# Patient Record
Sex: Male | Born: 1965 | Race: Black or African American | Hispanic: No | Marital: Married | Smoking: Current every day smoker
Health system: Southern US, Community
[De-identification: ages and names within clinical notes are randomized; demographics above are authoritative.]

## PROBLEM LIST (undated history)

## (undated) DIAGNOSIS — R569 Unspecified convulsions: Secondary | ICD-10-CM

## (undated) DIAGNOSIS — I10 Essential (primary) hypertension: Secondary | ICD-10-CM

## (undated) DIAGNOSIS — E7800 Pure hypercholesterolemia, unspecified: Secondary | ICD-10-CM

## (undated) DIAGNOSIS — K429 Umbilical hernia without obstruction or gangrene: Secondary | ICD-10-CM

## (undated) DIAGNOSIS — J45909 Unspecified asthma, uncomplicated: Secondary | ICD-10-CM

## (undated) HISTORY — PX: SPINAL FIXATION SURGERY: SHX1055

## (undated) HISTORY — PX: SHOULDER SURGERY: SHX246

## (undated) HISTORY — PX: OTHER SURGICAL HISTORY: SHX169

## (undated) HISTORY — PX: BRAIN SURGERY: SHX531

---

## 2005-12-05 ENCOUNTER — Emergency Department (HOSPITAL_COMMUNITY): Admission: EM | Admit: 2005-12-05 | Discharge: 2005-12-05 | Payer: Self-pay | Admitting: Emergency Medicine

## 2009-04-23 ENCOUNTER — Emergency Department (HOSPITAL_COMMUNITY): Admission: EM | Admit: 2009-04-23 | Discharge: 2009-04-23 | Payer: Self-pay | Admitting: Emergency Medicine

## 2009-07-13 ENCOUNTER — Emergency Department (HOSPITAL_COMMUNITY): Admission: EM | Admit: 2009-07-13 | Discharge: 2009-07-13 | Payer: Self-pay | Admitting: Emergency Medicine

## 2009-12-24 ENCOUNTER — Encounter: Admission: RE | Admit: 2009-12-24 | Discharge: 2009-12-24 | Payer: Self-pay | Admitting: Neurosurgery

## 2010-11-26 ENCOUNTER — Emergency Department (HOSPITAL_COMMUNITY)
Admission: EM | Admit: 2010-11-26 | Discharge: 2010-11-26 | Payer: Self-pay | Source: Home / Self Care | Admitting: Emergency Medicine

## 2010-11-29 LAB — BASIC METABOLIC PANEL
BUN: 17 mg/dL (ref 6–23)
CO2: 24 mEq/L (ref 19–32)
Chloride: 108 mEq/L (ref 96–112)
GFR calc Af Amer: >60 mL/min (ref >60)

## 2010-11-29 LAB — RAPID URINE DRUG SCREEN, HOSP PERFORMED
Amphetamines: NOT DETECTED
Opiates: NOT DETECTED
Tetrahydrocannabinol: POSITIVE — AB

## 2010-11-29 LAB — CBC
Hemoglobin: 13.8 g/dL (ref 13.0–17.0)
MCV: 86.5 fL (ref 78.0–100.0)
RBC: 4.58 MIL/uL (ref 4.22–5.81)
WBC: 7.8 10*3/uL (ref 4.0–10.5)

## 2010-11-29 LAB — DIFFERENTIAL
Basophils Absolute: 0 10*3/uL (ref 0.0–0.1)
Basophils Relative: 0 % (ref 0–1)
Lymphocytes Relative: 30 % (ref 12–46)
Monocytes Absolute: 0.5 10*3/uL (ref 0.1–1.0)
Monocytes Relative: 7 % (ref 3–12)
Neutro Abs: 4.8 10*3/uL (ref 1.7–7.7)
Neutrophils Relative %: 62 % (ref 43–77)

## 2010-11-29 LAB — URINALYSIS, ROUTINE W REFLEX MICROSCOPIC
Bilirubin Urine: NEGATIVE
Leukocytes, UA: NEGATIVE
Protein, ur: NEGATIVE mg/dL
Specific Gravity, Urine: 1.024 (ref 1.005–1.030)

## 2010-11-29 LAB — CARBAMAZEPINE LEVEL, TOTAL: Carbamazepine Lvl: <0.3 ug/mL — ABNORMAL LOW (ref 4.0–12.0)

## 2010-11-29 LAB — URINE MICROSCOPIC-ADD ON

## 2011-01-11 ENCOUNTER — Emergency Department (HOSPITAL_COMMUNITY): Payer: Medicare Other

## 2011-01-11 ENCOUNTER — Emergency Department (HOSPITAL_COMMUNITY)
Admission: EM | Admit: 2011-01-11 | Discharge: 2011-01-11 | Disposition: A | Discharge status: Home / Self Care | Payer: Medicare Other | Attending: Emergency Medicine | Admitting: Emergency Medicine

## 2011-01-11 DIAGNOSIS — R51 Headache: Secondary | ICD-10-CM | POA: Insufficient documentation

## 2011-01-11 DIAGNOSIS — G9389 Other specified disorders of brain: Secondary | ICD-10-CM | POA: Insufficient documentation

## 2011-01-11 DIAGNOSIS — Z9889 Other specified postprocedural states: Secondary | ICD-10-CM | POA: Insufficient documentation

## 2011-01-11 DIAGNOSIS — F29 Unspecified psychosis not due to a substance or known physiological condition: Secondary | ICD-10-CM | POA: Insufficient documentation

## 2011-01-11 DIAGNOSIS — G40909 Epilepsy, unspecified, not intractable, without status epilepticus: Secondary | ICD-10-CM | POA: Insufficient documentation

## 2011-01-11 DIAGNOSIS — I10 Essential (primary) hypertension: Secondary | ICD-10-CM | POA: Insufficient documentation

## 2011-01-11 LAB — POCT I-STAT, CHEM 8
Chloride: 104 mEq/L (ref 96–112)
Creatinine, Ser: 1.4 mg/dL (ref 0.4–1.5)
Glucose, Bld: 87 mg/dL (ref 70–99)
Potassium: 3.8 mEq/L (ref 3.5–5.1)
TCO2: 21 mmol/L (ref 0–100)

## 2011-01-11 LAB — BASIC METABOLIC PANEL
BUN: 10 mg/dL (ref 6–23)
Calcium: 9.4 mg/dL (ref 8.4–10.5)
Glucose, Bld: 89 mg/dL (ref 70–99)

## 2011-01-11 LAB — GLUCOSE, CAPILLARY: Glucose-Capillary: 82 mg/dL (ref 70–99)

## 2011-01-11 LAB — DIFFERENTIAL
Eosinophils Absolute: 0 10*3/uL (ref 0.0–0.7)
Eosinophils Relative: 0 % (ref 0–5)
Lymphocytes Relative: 37 % (ref 12–46)
Monocytes Absolute: 0.4 10*3/uL (ref 0.1–1.0)

## 2011-01-11 LAB — URINE MICROSCOPIC-ADD ON

## 2011-01-11 LAB — URINALYSIS, ROUTINE W REFLEX MICROSCOPIC
Bilirubin Urine: NEGATIVE
Nitrite: NEGATIVE
Protein, ur: 30 mg/dL — AB
Specific Gravity, Urine: 1.022 (ref 1.005–1.030)
pH: 5.5 (ref 5.0–8.0)

## 2011-01-11 LAB — CBC
MCH: 29.5 pg (ref 26.0–34.0)
MCHC: 33.5 g/dL (ref 30.0–36.0)
MCV: 88 fL (ref 78.0–100.0)
RDW: 13.5 % (ref 11.5–15.5)

## 2011-01-11 LAB — CARBAMAZEPINE LEVEL, TOTAL: Carbamazepine Lvl: <2 ug/mL — ABNORMAL LOW (ref 4.0–12.0)

## 2011-02-14 LAB — CBC
MCV: 90.6 fL (ref 78.0–100.0)
Platelets: 314 10*3/uL (ref 150–400)
RDW: 14 % (ref 11.5–15.5)

## 2011-02-14 LAB — DIFFERENTIAL
Basophils Relative: 1 % (ref 0–1)
Lymphocytes Relative: 31 % (ref 12–46)
Monocytes Absolute: 0.5 10*3/uL (ref 0.1–1.0)
Monocytes Relative: 10 % (ref 3–12)
Neutro Abs: 3 10*3/uL (ref 1.7–7.7)

## 2011-02-14 LAB — COMPREHENSIVE METABOLIC PANEL
AST: 20 U/L (ref 0–37)
Albumin: 4.1 g/dL (ref 3.5–5.2)
Alkaline Phosphatase: 82 U/L (ref 39–117)
BUN: 17 mg/dL (ref 6–23)
Chloride: 103 mEq/L (ref 96–112)
Creatinine, Ser: 1.05 mg/dL (ref 0.4–1.5)
Glucose, Bld: 97 mg/dL (ref 70–99)
Sodium: 144 mEq/L (ref 135–145)
Total Protein: 7.5 g/dL (ref 6.0–8.3)

## 2011-02-14 LAB — CARBAMAZEPINE LEVEL, TOTAL: Carbamazepine Lvl: 8.3 ug/mL (ref 4.0–12.0)

## 2012-02-05 ENCOUNTER — Observation Stay (HOSPITAL_COMMUNITY)
Admission: EM | Admit: 2012-02-05 | Discharge: 2012-02-06 | Disposition: A | Discharge status: Home / Self Care | Payer: Medicare Other | Attending: Internal Medicine | Admitting: Internal Medicine

## 2012-02-05 ENCOUNTER — Other Ambulatory Visit: Payer: Self-pay

## 2012-02-05 ENCOUNTER — Encounter (HOSPITAL_COMMUNITY): Payer: Self-pay | Admitting: *Deleted

## 2012-02-05 ENCOUNTER — Emergency Department (HOSPITAL_COMMUNITY): Payer: Medicare Other

## 2012-02-05 DIAGNOSIS — K219 Gastro-esophageal reflux disease without esophagitis: Secondary | ICD-10-CM | POA: Insufficient documentation

## 2012-02-05 DIAGNOSIS — F17200 Nicotine dependence, unspecified, uncomplicated: Secondary | ICD-10-CM | POA: Insufficient documentation

## 2012-02-05 DIAGNOSIS — Z720 Tobacco use: Secondary | ICD-10-CM | POA: Diagnosis present

## 2012-02-05 DIAGNOSIS — I10 Essential (primary) hypertension: Secondary | ICD-10-CM | POA: Diagnosis present

## 2012-02-05 DIAGNOSIS — E7800 Pure hypercholesterolemia, unspecified: Secondary | ICD-10-CM | POA: Insufficient documentation

## 2012-02-05 DIAGNOSIS — G40909 Epilepsy, unspecified, not intractable, without status epilepticus: Secondary | ICD-10-CM | POA: Insufficient documentation

## 2012-02-05 DIAGNOSIS — E119 Type 2 diabetes mellitus without complications: Secondary | ICD-10-CM | POA: Insufficient documentation

## 2012-02-05 DIAGNOSIS — Z8249 Family history of ischemic heart disease and other diseases of the circulatory system: Secondary | ICD-10-CM | POA: Insufficient documentation

## 2012-02-05 DIAGNOSIS — R0602 Shortness of breath: Secondary | ICD-10-CM | POA: Insufficient documentation

## 2012-02-05 DIAGNOSIS — R079 Chest pain, unspecified: Secondary | ICD-10-CM | POA: Diagnosis present

## 2012-02-05 DIAGNOSIS — R61 Generalized hyperhidrosis: Secondary | ICD-10-CM | POA: Insufficient documentation

## 2012-02-05 DIAGNOSIS — R0789 Other chest pain: Principal | ICD-10-CM | POA: Insufficient documentation

## 2012-02-05 HISTORY — DX: Essential (primary) hypertension: I10

## 2012-02-05 HISTORY — DX: Unspecified convulsions: R56.9

## 2012-02-05 LAB — CBC
HCT: 37.6 % — ABNORMAL LOW (ref 39.0–52.0)
Hemoglobin: 12.9 g/dL — ABNORMAL LOW (ref 13.0–17.0)
RBC: 4.32 MIL/uL (ref 4.22–5.81)

## 2012-02-05 LAB — DIFFERENTIAL
Lymphs Abs: 4.1 10*3/uL — ABNORMAL HIGH (ref 0.7–4.0)
Monocytes Absolute: 0.5 10*3/uL (ref 0.1–1.0)
Monocytes Relative: 6 % (ref 3–12)
Neutro Abs: 3.4 10*3/uL (ref 1.7–7.7)
Neutrophils Relative %: 41 % — ABNORMAL LOW (ref 43–77)

## 2012-02-05 LAB — BASIC METABOLIC PANEL
BUN: 19 mg/dL (ref 6–23)
CO2: 27 mEq/L (ref 19–32)
Chloride: 100 mEq/L (ref 96–112)
Glucose, Bld: 111 mg/dL — ABNORMAL HIGH (ref 70–99)
Potassium: 3.6 mEq/L (ref 3.5–5.1)

## 2012-02-05 MED ORDER — ONDANSETRON HCL 4 MG/2ML IJ SOLN
4.0000 mg | Freq: Once | INTRAMUSCULAR | Status: AC
  Administered 2012-02-05: 4 mg via INTRAVENOUS
  Filled 2012-02-05: qty 2

## 2012-02-05 MED ORDER — MORPHINE SULFATE 4 MG/ML IJ SOLN
4.0000 mg | Freq: Once | INTRAMUSCULAR | Status: AC
  Administered 2012-02-05: 4 mg via INTRAVENOUS
  Filled 2012-02-05: qty 1

## 2012-02-05 NOTE — ED Provider Notes (Signed)
History     CSN: [id number]  Arrival date & time 02/05/12  2117   First MD Initiated Contact with Patient 02/05/12 2127      Chief Complaint  Patient presents with  . Chest Pain    (Consider location/radiation/quality/duration/timing/severity/associated sxs/prior treatment) Patient is a 46 y.o. male presenting with chest pain. The history is provided by the patient.  Chest Pain The chest pain began 1 - 2 hours ago. Chest pain occurs constantly. The chest pain is improving. At its most intense, the pain is at 7/10. The pain is currently at 3/10. The severity of the pain is moderate. The quality of the pain is described as pressure-like and sharp. The pain does not radiate. Primary symptoms include shortness of breath, nausea and dizziness. Pertinent negatives for primary symptoms include no fever, no palpitations and no abdominal pain.  Dizziness also occurs with nausea and diaphoresis. Dizziness does not occur with weakness.   Associated symptoms include diaphoresis.  Pertinent negatives for associated symptoms include no lower extremity edema, no numbness, no orthopnea and no weakness.   Pt states he has had these chest pains on and off for several months. He states they usually are random and occur at rest. States he does get SOB with walking, but states "I am not very active, i am disabled because of my epilepsy."  States these pains are associated with sob, nausea, diaphoresis. Today he was on the phone with his brother when he had sudden on set of this pain, stats he got sweaty, nauseated, and his brother told him to call EMS. Pt received  325mg  asa, 3 sl nitros, and 4mg  morphine IV. pts pain improved. He has hx of htn on 6 medications for it, he is a smoker, family hx of cardiac disease. He states his doctor is trying to get him to be seen by cardiologist for "a test" but he has not seen them yet.  Past Medical History  Diagnosis Date  . Hypertension   . Diabetes mellitus   .  Seizures     Past Surgical History  Procedure Date  . Brain surgery   . Brain surgery     No family history on file.  History  Substance Use Topics  . Smoking status: Not on file  . Smokeless tobacco: Not on file  . Alcohol Use:       Review of Systems  Constitutional: Positive for diaphoresis. Negative for fever and chills.  HENT: Negative.   Eyes: Negative.   Respiratory: Positive for chest tightness and shortness of breath. Negative for choking.   Cardiovascular: Positive for chest pain. Negative for palpitations, orthopnea and leg swelling.  Gastrointestinal: Positive for nausea. Negative for abdominal pain.  Genitourinary: Negative.   Musculoskeletal: Negative.   Skin: Negative.   Neurological: Positive for dizziness. Negative for weakness and numbness.  Psychiatric/Behavioral: Negative.     Allergies  Keppra  Home Medications   Current Outpatient Rx  Name Route Sig Dispense Refill  . AMLODIPINE BESYLATE 5 MG PO TABS Oral Take 10 mg by mouth daily.    Marland Kitchen CARBAMAZEPINE ER 400 MG PO TB12 Oral Take 800 mg by mouth 2 (two) times daily.    Marland Kitchen CLONIDINE HCL 0.3 MG PO TABS Oral Take 0.3 mg by mouth 2 (two) times daily.    Marland Kitchen HYDRALAZINE HCL 10 MG PO TABS Oral Take 10 mg by mouth 3 (three) times daily.    Marland Kitchen LISINOPRIL-HYDROCHLOROTHIAZIDE 20-12.5 MG PO TABS Oral Take 2 tablets by  mouth daily.    Marland Kitchen METOPROLOL SUCCINATE ER 100 MG PO TB24 Oral Take 100 mg by mouth daily. Take with or immediately following a meal.    . OXYCODONE-ACETAMINOPHEN 5-325 MG PO TABS Oral Take 1 tablet by mouth every 4 (four) hours as needed. For pain    . VALACYCLOVIR HCL 1 G PO TABS Oral Take 2,000 mg by mouth 2 (two) times daily.      BP 140/79  Pulse 64  Resp 15  SpO2 98%  Physical Exam  Nursing note and vitals reviewed. Constitutional: He is oriented to person, place, and time. He appears well-developed and well-nourished.  HENT:  Head: Normocephalic and atraumatic.  Eyes: Conjunctivae  are normal.  Neck: Neck supple.  Cardiovascular: Normal rate, regular rhythm and normal heart sounds.   Pulmonary/Chest: Effort normal and breath sounds normal. No respiratory distress. He has no wheezes. He exhibits no tenderness.  Abdominal: Soft. Bowel sounds are normal. He exhibits no distension. There is no tenderness.  Musculoskeletal: He exhibits no edema.  Neurological: He is alert and oriented to person, place, and time.  Skin: Skin is warm.  Psychiatric: He has a normal mood and affect.    ED Course  Procedures (including critical care time)  Labs Reviewed  CBC - Abnormal; Notable for the following:    Hemoglobin 12.9 (*)    HCT 37.6 (*)    All other components within normal limits  DIFFERENTIAL - Abnormal; Notable for the following:    Neutrophils Relative 41 (*)    Lymphocytes Relative 50 (*)    Lymphs Abs 4.1 (*)    All other components within normal limits  BASIC METABOLIC PANEL - Abnormal; Notable for the following:    Glucose, Bld 111 (*)    GFR calc non Af Amer 77 (*)    GFR calc Af Amer 89 (*)    All other components within normal limits  POCT I-STAT TROPONIN I  URINE RAPID DRUG SCREEN (HOSP PERFORMED)   Dg Chest 2 View  02/05/2012  *RADIOLOGY REPORT*  Clinical Data: Headache, shortness of breath, chest pains.  CHEST - 2 VIEW  Comparison: None.  Findings: Borderline heart size with normal pulmonary vascularity. No focal airspace consolidation in the lungs.  No blunting of costophrenic angles.  No pneumothorax.  Postoperative changes in the cervical spine. Widening of the right acromioclavicular joint space suggesting prior resection or resorption.  IMPRESSION: No evidence of active pulmonary disease.  Original Report Authenticated By: Marlon Pel, M.D.    Date: 02/06/2012  Rate: 66  Rhythm: normal sinus rhythm  QRS Axis: normal  Intervals: normal  ST/T Wave abnormalities: early repolarization  Conduction Disutrbances:none  Narrative Interpretation:     Old EKG Reviewed: unchanged  PT with concerning symptoms. Risk factors are pt has hx of hypertension, male gender, family history of cardiac problems. He is a smoker. No prior cardiac work up. Will admit for rule out.  Spoke with [facility], asked to order lovenox, will admit.  No diagnosis found.    MDM          Lottie Mussel, PA 02/06/12 0030

## 2012-02-05 NOTE — ED Provider Notes (Signed)
Medical screening examination/treatment/procedure(s) were conducted as a shared visit with non-physician practitioner(s) and myself.  I personally evaluated the patient during the encounter 46 year old, male, smoker, with hypertension, presents emergency department complaining of chest pressure, shortness of breath, radiation into his left arm, and diaphoresis.  The symptoms began while he was talking on the telephone with his brother.  He had mild nausea.  Has not vomited.  He got a smoker's cough.  He denies fevers, chills, leg pain or swelling.  His mother had a heart attack when she was 24.  His physical examination is normal.  He was given aspirin and nitroglycerin by EMS.  His EKG shows early repolarization and his initial cardiac enzymes, were normal.  We will get him to the hospital for evaluation and treatment of angina  Cheri Guppy, MD 02/05/12 2318

## 2012-02-05 NOTE — ED Notes (Signed)
Pt called EMS c/o acute onset sternal chest pressure radiating to L arm (onset 2030), accompanied by nausea, sob.  EMS gave 324 asa, 4 0.4 mg nitro, 4 mg morphine. Both dropped pain from 10/10 to 4/10.  18G SL. in RAC.  EMS states nsr of 70 on monitor.

## 2012-02-05 NOTE — ED Notes (Signed)
EKG DONE BY EMT R Malissie Musgrave OLD AND NEW EKG GIVEN TO DR CAPOROSSI 

## 2012-02-05 NOTE — ED Notes (Signed)
Pt states no relief with morphine.  States reduction in chest pressure, but headache continues to be 8/10.

## 2012-02-05 NOTE — ED Notes (Signed)
Wife, Tira's # [phone].

## 2012-02-06 ENCOUNTER — Inpatient Hospital Stay (HOSPITAL_COMMUNITY): Payer: Medicare Other

## 2012-02-06 ENCOUNTER — Encounter (HOSPITAL_COMMUNITY): Payer: Self-pay | Admitting: Internal Medicine

## 2012-02-06 DIAGNOSIS — R079 Chest pain, unspecified: Secondary | ICD-10-CM | POA: Diagnosis present

## 2012-02-06 DIAGNOSIS — G40909 Epilepsy, unspecified, not intractable, without status epilepticus: Secondary | ICD-10-CM | POA: Insufficient documentation

## 2012-02-06 DIAGNOSIS — I10 Essential (primary) hypertension: Secondary | ICD-10-CM | POA: Diagnosis present

## 2012-02-06 DIAGNOSIS — Z720 Tobacco use: Secondary | ICD-10-CM | POA: Diagnosis present

## 2012-02-06 LAB — CBC
MCH: 30.1 pg (ref 26.0–34.0)
MCV: 87.7 fL (ref 78.0–100.0)
Platelets: 286 10*3/uL (ref 150–400)
RBC: 4.32 MIL/uL (ref 4.22–5.81)
RDW: 13.9 % (ref 11.5–15.5)

## 2012-02-06 LAB — CARDIAC PANEL(CRET KIN+CKTOT+MB+TROPI)
CK, MB: 2.4 ng/mL (ref 0.3–4.0)
Total CK: 235 U/L — ABNORMAL HIGH (ref 7–232)
Total CK: 202 U/L (ref 7–232)
Troponin I: <0.3 ng/mL (ref <0.30)

## 2012-02-06 LAB — LIPID PANEL
HDL: 35 mg/dL — ABNORMAL LOW (ref >39)
LDL Cholesterol: 134 mg/dL — ABNORMAL HIGH (ref 0–99)
Triglycerides: 180 mg/dL — ABNORMAL HIGH (ref <150)

## 2012-02-06 LAB — BASIC METABOLIC PANEL
BUN: 20 mg/dL (ref 6–23)
CO2: 28 mEq/L (ref 19–32)
Calcium: 9 mg/dL (ref 8.4–10.5)
Creatinine, Ser: 1.06 mg/dL (ref 0.50–1.35)
Glucose, Bld: 100 mg/dL — ABNORMAL HIGH (ref 70–99)

## 2012-02-06 MED ORDER — CLONIDINE HCL 0.3 MG PO TABS
0.3000 mg | ORAL_TABLET | Freq: Two times a day (BID) | ORAL | Status: DC
  Administered 2012-02-06 (×2): 0.3 mg via ORAL
  Filled 2012-02-06 (×3): qty 1

## 2012-02-06 MED ORDER — OXYCODONE-ACETAMINOPHEN 5-325 MG PO TABS: 1.0000 | ORAL_TABLET | ORAL | Status: DC | PRN

## 2012-02-06 MED ORDER — CARBAMAZEPINE ER 400 MG PO TB12
800.0000 mg | ORAL_TABLET | Freq: Two times a day (BID) | ORAL | Status: DC
  Administered 2012-02-06 (×2): 800 mg via ORAL
  Filled 2012-02-06 (×3): qty 2

## 2012-02-06 MED ORDER — PANTOPRAZOLE SODIUM 40 MG PO TBEC: 40.0000 mg | DELAYED_RELEASE_TABLET | Freq: Every day | ORAL | Status: DC

## 2012-02-06 MED ORDER — ATORVASTATIN CALCIUM 10 MG PO TABS: 10.0000 mg | ORAL_TABLET | Freq: Every day | ORAL | Status: DC

## 2012-02-06 MED ORDER — MORPHINE SULFATE 4 MG/ML IJ SOLN
6.0000 mg | Freq: Once | INTRAMUSCULAR | Status: AC
  Administered 2012-02-06: 6 mg via INTRAVENOUS
  Filled 2012-02-06 (×2): qty 1

## 2012-02-06 MED ORDER — METOPROLOL SUCCINATE ER 100 MG PO TB24
100.0000 mg | ORAL_TABLET | Freq: Every day | ORAL | Status: DC
  Administered 2012-02-06: 100 mg via ORAL
  Filled 2012-02-06: qty 1

## 2012-02-06 MED ORDER — ASPIRIN EC 325 MG PO TBEC
325.0000 mg | DELAYED_RELEASE_TABLET | Freq: Every day | ORAL | Status: DC
  Administered 2012-02-06: 325 mg via ORAL
  Filled 2012-02-06: qty 1

## 2012-02-06 MED ORDER — OXYCODONE-ACETAMINOPHEN 5-325 MG PO TABS
1.0000 | ORAL_TABLET | ORAL | Status: DC | PRN
  Administered 2012-02-06 (×2): 1 via ORAL
  Filled 2012-02-06 (×2): qty 1

## 2012-02-06 MED ORDER — ASPIRIN 325 MG PO TBEC: 325.0000 mg | DELAYED_RELEASE_TABLET | Freq: Every day | ORAL | Status: AC

## 2012-02-06 MED ORDER — SODIUM CHLORIDE 0.9 % IJ SOLN: 3.0000 mL | INTRAMUSCULAR | Status: DC | PRN

## 2012-02-06 MED ORDER — AMLODIPINE BESYLATE 10 MG PO TABS
10.0000 mg | ORAL_TABLET | Freq: Every day | ORAL | Status: DC
  Administered 2012-02-06: 10 mg via ORAL
  Filled 2012-02-06: qty 1

## 2012-02-06 MED ORDER — REGADENOSON 0.4 MG/5ML IV SOLN
0.4000 mg | Freq: Once | INTRAVENOUS | Status: AC
  Administered 2012-02-06: 0.4 mg via INTRAVENOUS

## 2012-02-06 MED ORDER — ENOXAPARIN SODIUM 150 MG/ML ~~LOC~~ SOLN: 1.0000 mg/kg | Freq: Two times a day (BID) | SUBCUTANEOUS | Status: DC

## 2012-02-06 MED ORDER — TECHNETIUM TC 99M TETROFOSMIN IV KIT
10.0000 | PACK | Freq: Once | INTRAVENOUS | Status: AC | PRN
  Administered 2012-02-06: 10 via INTRAVENOUS

## 2012-02-06 MED ORDER — TECHNETIUM TC 99M TETROFOSMIN IV KIT
30.0000 | PACK | Freq: Once | INTRAVENOUS | Status: AC | PRN
  Administered 2012-02-06: 30 via INTRAVENOUS

## 2012-02-06 MED ORDER — LISINOPRIL 20 MG PO TABS
20.0000 mg | ORAL_TABLET | Freq: Every day | ORAL | Status: DC
  Administered 2012-02-06: 20 mg via ORAL
  Filled 2012-02-06: qty 1

## 2012-02-06 MED ORDER — ENOXAPARIN SODIUM 80 MG/0.8ML ~~LOC~~ SOLN
80.0000 mg | SUBCUTANEOUS | Status: DC
  Filled 2012-02-06: qty 0.8

## 2012-02-06 MED ORDER — HYDRALAZINE HCL 10 MG PO TABS
10.0000 mg | ORAL_TABLET | Freq: Three times a day (TID) | ORAL | Status: DC
  Administered 2012-02-06: 10 mg via ORAL
  Filled 2012-02-06 (×3): qty 1

## 2012-02-06 MED ORDER — SODIUM CHLORIDE 0.9 % IJ SOLN
3.0000 mL | Freq: Two times a day (BID) | INTRAMUSCULAR | Status: DC
  Administered 2012-02-06 (×2): 3 mL via INTRAVENOUS

## 2012-02-06 MED ORDER — LISINOPRIL-HYDROCHLOROTHIAZIDE 20-12.5 MG PO TABS: 2.0000 | ORAL_TABLET | Freq: Every day | ORAL | Status: DC

## 2012-02-06 MED ORDER — ATORVASTATIN CALCIUM 10 MG PO TABS
10.0000 mg | ORAL_TABLET | Freq: Every day | ORAL | Status: DC
  Filled 2012-02-06: qty 1

## 2012-02-06 MED ORDER — VALACYCLOVIR HCL 500 MG PO TABS
2000.0000 mg | ORAL_TABLET | Freq: Two times a day (BID) | ORAL | Status: DC
  Administered 2012-02-06: 2000 mg via ORAL
  Administered 2012-02-06: 1000 mg via ORAL
  Filled 2012-02-06 (×3): qty 4

## 2012-02-06 MED ORDER — ENOXAPARIN SODIUM 80 MG/0.8ML ~~LOC~~ SOLN
80.0000 mg | Freq: Two times a day (BID) | SUBCUTANEOUS | Status: DC
  Administered 2012-02-06: 80 mg via SUBCUTANEOUS
  Filled 2012-02-06 (×2): qty 0.8

## 2012-02-06 MED ORDER — HYDROCHLOROTHIAZIDE 12.5 MG PO CAPS
12.5000 mg | ORAL_CAPSULE | Freq: Every day | ORAL | Status: DC
  Administered 2012-02-06: 12.5 mg via ORAL
  Filled 2012-02-06: qty 1

## 2012-02-06 MED ORDER — HYDROMORPHONE HCL PF 1 MG/ML IJ SOLN: 1.0000 mg | INTRAMUSCULAR | Status: DC | PRN

## 2012-02-06 MED ORDER — SODIUM CHLORIDE 0.9 % IV SOLN: 250.0000 mL | INTRAVENOUS | Status: DC | PRN

## 2012-02-06 MED ORDER — SODIUM CHLORIDE 0.9 % IJ SOLN: 3.0000 mL | Freq: Two times a day (BID) | INTRAMUSCULAR | Status: DC

## 2012-02-06 NOTE — ED Notes (Signed)
MD at bedside. Internal med here to see pt

## 2012-02-06 NOTE — Progress Notes (Signed)
ANTICOAGULATION CONSULT NOTE - Initial Consult  Pharmacy Consult for lovenox  Indication: chest pain/ACS  Allergies  Allergen Reactions  . Keppra     Pt states, "When I take Keppra it makes me feel like I am having a heart attack."     Patient Measurements: Weight: 169 lb 5 oz (76.8 kg) Heparin Dosing Weight:   Vital Signs: BP: 148/83 mmHg (04/01 0000) Pulse Rate: 70  (04/01 0000)  Labs:  Basename 02/05/12 2143  HGB 12.9*  HCT 37.6*  PLT 298  APTT --  LABPROT --  INR --  HEPARINUNFRC --  CREATININE 1.12  CKTOTAL --  CKMB --  TROPONINI --   CrCl is unknown because there is no height on file for the current visit.  Medical History: Past Medical History  Diagnosis Date  . Hypertension   . Diabetes mellitus   . Seizures     Medications:  Prescriptions prior to admission  Medication Sig Dispense Refill  . amLODipine (NORVASC) 5 MG tablet Take 10 mg by mouth daily.      . carbamazepine (TEGRETOL XR) 400 MG 12 hr tablet Take 800 mg by mouth 2 (two) times daily.      . cloNIDine (CATAPRES) 0.3 MG tablet Take 0.3 mg by mouth 2 (two) times daily.      . hydrALAZINE (APRESOLINE) 10 MG tablet Take 10 mg by mouth 3 (three) times daily.      Marland Kitchen lisinopril-hydrochlorothiazide (PRINZIDE,ZESTORETIC) 20-12.5 MG per tablet Take 2 tablets by mouth daily.      . metoprolol succinate (TOPROL-XL) 100 MG 24 hr tablet Take 100 mg by mouth daily. Take with or immediately following a meal.      . oxyCODONE-acetaminophen (PERCOCET) 5-325 MG per tablet Take 1 tablet by mouth every 4 (four) hours as needed. For pain      . valACYclovir (VALTREX) 1000 MG tablet Take 2,000 mg by mouth 2 (two) times daily.        Assessment: 46 yo male with chest pain. Risk factors include htn dm and smoking and strong family hx. lovenox full dose while R/O MI Goal of Therapy:  Full dose lovenox renally adjusted if pertinent    Plan:  lovenox 80 mg q12h (next dose at noon) cbc q72 hours while on  lovenox.   Janice Coffin 02/06/2012,1:13 AM

## 2012-02-06 NOTE — Progress Notes (Signed)
Patient ID: Shawn Potts  male  EQA:[id number]    DOB: 10/12/1966    DOA: 02/05/2012  PCP: [name], MD, MD  Subjective: Chest pain improving, atypical  Objective: Weight change:  No intake or output data in the 24 hours ending 02/06/12 1604 Blood pressure 121/73, pulse 53, temperature 97.8 F (36.6 C), resp. rate 18, height 5\' 8"  (1.727 m), weight 73.9 kg (162 lb 14.7 oz), SpO2 97.00%.  Physical Exam: General: Alert and awake, oriented x3, not in any acute distress. HEENT: anicteric sclera, pupils reactive to light and accommodation, EOMI CVS: S1-S2 clear, no murmur rubs or gallops Chest: Chest wall tenderness on the palpation clear to auscultation bilaterally, no wheezing, rales or rhonchi Abdomen: soft nontender, nondistended, normal bowel sounds, no organomegaly Extremities: no cyanosis, clubbing or edema noted bilaterally Neuro: Cranial nerves II-XII intact, no focal neurological deficits  Lab Results: Basic Metabolic Panel:  Lab 02/06/12 9798 02/05/12 2143  NA 141 139  K 4.4 3.6  CL 106 100  CO2 28 27  GLUCOSE 100* 111*  BUN 20 19  CREATININE 1.06 1.12  CALCIUM 9.0 9.4  MG -- --  PHOS -- --   CBC:  Lab 02/06/12 0143 02/05/12 2143  WBC 8.9 8.2  NEUTROABS -- 3.4  HGB 13.0 12.9*  HCT 37.9* 37.6*  MCV 87.7 87.0  PLT 286 298   Cardiac Enzymes:  Lab 02/06/12 1349 02/06/12 0857 02/06/12 0130  CKTOTAL 202 235* 291*  CKMB 2.3 2.4 2.5  CKMBINDEX -- -- --  TROPONINI <0.30 <0.30 <0.30    Studies/Results: Dg Chest 2 View  02/05/2012  *RADIOLOGY REPORT*  Clinical Data: Headache, shortness of breath, chest pains.  CHEST - 2 VIEW  Comparison: None.  Findings: Borderline heart size with normal pulmonary vascularity. No focal airspace consolidation in the lungs.  No blunting of costophrenic angles.  No pneumothorax.  Postoperative changes in the cervical spine. Widening of the right acromioclavicular joint space suggesting prior resection or resorption.   IMPRESSION: No evidence of active pulmonary disease.  Original Report Authenticated By: Marlon Pel, M.D.    Medications: Scheduled Meds:   . amLODipine  10 mg Oral Daily  . aspirin EC  325 mg Oral Daily  . atorvastatin  10 mg Oral q1800  . carbamazepine  800 mg Oral BID  . cloNIDine  0.3 mg Oral BID  . enoxaparin (LOVENOX) injection  80 mg Subcutaneous To Major  . enoxaparin (LOVENOX) injection  80 mg Subcutaneous Q12H  . hydrALAZINE  10 mg Oral TID  . hydrochlorothiazide  12.5 mg Oral Daily  . lisinopril  20 mg Oral Daily  . metoprolol succinate  100 mg Oral Daily  . morphine  4 mg Intravenous Once  . morphine  6 mg Intravenous Once  . ondansetron  4 mg Intravenous Once  . regadenoson  0.4 mg Intravenous Once  . sodium chloride  3 mL Intravenous Q12H  . valACYclovir  2,000 mg Oral BID  . DISCONTD: enoxaparin (LOVENOX) injection  1 mg/kg Subcutaneous Q12H  . DISCONTD: lisinopril-hydrochlorothiazide  2 tablet Oral Daily  . DISCONTD: sodium chloride  3 mL Intravenous Q12H   Continuous Infusions:    Assessment/Plan: Principal Problem:  *Chest pain: Atypical but high risk given hypertension and hyperlipidemia, however has a chest wall tenderness on deep palpation - Patient denies history of diabetes mellitus - Cardiac enzymes x2 negative, stress test done today, results pending - Appreciate cardiology conditions  Active Problems:  HTN (hypertension): Continue current antihypertensives  Tobacco abuse: Counseled strongly to quit smoking, patient has prescription of Chantix from his primary M.D.  DVT Prophylaxis: Lovenox  Code Status: Full code  Disposition: Awaiting stress test results for further management   LOS: 1 day   Sid Greener M.D. [facility] 02/06/2012, 4:04 PM Pager: [phone]

## 2012-02-06 NOTE — ED Notes (Signed)
Complain of HA Md notified, waiting for orders

## 2012-02-06 NOTE — Consult Note (Signed)
Pt smokes 1/2 ppd and is in action stage. Has Rx from his MD for Chantix and plans to start them after d/c. Referred to 1-800 quit now for f/u and support. Discussed oral fixation substitutes, second hand smoke and in home smoking policy. Reviewed and gave pt Written education/contact information.

## 2012-02-06 NOTE — Discharge Summary (Signed)
Physician Discharge Summary  Patient ID: Shawn Potts MRN: [id number] DOB/AGE: 04-12-1966 46 y.o.  Admit date: 02/05/2012 Discharge date: 02/06/2012  Primary Care Physician:  [name], MD, MD  Discharge Diagnoses:    . atypical Chest pain, ACS ruled out , stress test EF 53% no reversible ischemia/infarction, likely MSK  .HTN (hypertension) .Tobacco abuse GERD  Consults: cardiology ( Dr. Sharyn Lull)  Discharge Medications: Medication List  As of 02/06/2012  4:14 PM   TAKE these medications         amLODipine 5 MG tablet   Commonly known as: NORVASC   Take 10 mg by mouth daily.      aspirin 325 MG EC tablet   Take 1 tablet (325 mg total) by mouth daily.      atorvastatin 10 MG tablet   Commonly known as: LIPITOR   Take 1 tablet (10 mg total) by mouth daily.      carbamazepine 400 MG 12 hr tablet   Commonly known as: TEGRETOL XR   Take 800 mg by mouth 2 (two) times daily.      cloNIDine 0.3 MG tablet   Commonly known as: CATAPRES   Take 0.3 mg by mouth 2 (two) times daily.      hydrALAZINE 10 MG tablet   Commonly known as: APRESOLINE   Take 10 mg by mouth 3 (three) times daily.      lisinopril-hydrochlorothiazide 20-12.5 MG per tablet   Commonly known as: PRINZIDE,ZESTORETIC   Take 2 tablets by mouth daily.      metoprolol succinate 100 MG 24 hr tablet   Commonly known as: TOPROL-XL   Take 100 mg by mouth daily. Take with or immediately following a meal.      oxyCODONE-acetaminophen 5-325 MG per tablet   Commonly known as: PERCOCET   Take 1 tablet by mouth every 4 (four) hours as needed for pain. For pain      pantoprazole 40 MG tablet   Commonly known as: PROTONIX   Take 1 tablet (40 mg total) by mouth daily.      valACYclovir 1000 MG tablet   Commonly known as: VALTREX   Take 2,000 mg by mouth 2 (two) times daily.             Brief H and P: For complete details please refer to admission H and P, but in brief, patient is a 46 year old male with  a history of seizure disorder, hypertension, chronic neck pain on narcotics, tobacco abuse, family history of premature gallbladder disease presented to the emergency room with intermittent substernal chest pain for past 3 weeks. Evaluation in ED included EKG with nonspecific ST-T wave changes negative cardiac markers. Patient was admitted for further workup and ACS rule out  Hospital Course:   Chest pain: Atypical but high risk given hypertension and hyperlipidemia, however he did have chest wall tenderness on deep palpation. Patient denied any history of diabetes mellitus. Patient was admitted for observation, cardiac enzymes were obtained which was negative. Cardiology was consulted, patient was seen by Dr Sharyn Lull. The patient underwent nuclear medicine stress test which showed EF of 53% with no reversible ischemia/infarction. Patient was continued on pain medication given muscle skeletal component to the pain. Patient did have 2-D echocardiogram, results of which are pending at the time of discharge. He was also given a prescription for PPI. Patient will follow up with his primary care physician in 10 days for hospital followup.  HTN (hypertension): Continue current antihypertensives   Tobacco abuse:  Counseled strongly to quit smoking, patient has prescription of Chantix from his primary M.D.   Day of Discharge BP 121/73  Pulse 53  Temp 97.8 F (36.6 C)  Resp 18  Ht 5\' 8"  (1.727 m)  Wt 73.9 kg (162 lb 14.7 oz)  BMI 24.77 kg/m2  SpO2 97%  Physical Exam: General: Alert and awake oriented x3 not in any acute distress. HEENT: anicteric sclera, pupils reactive to light and accommodation CVS: S1-S2 clear no murmur rubs or gallops Chest: chest wall tenderness to deep palpation, clear to auscultation bilaterally, no wheezing rales or rhonchi Abdomen: soft nontender, nondistended, normal bowel sounds, no organomegaly Extremities: no cyanosis, clubbing or edema noted bilaterally Neuro: Cranial  nerves II-XII intact, no focal neurological deficits   The results of significant diagnostics from this hospitalization (including imaging, microbiology, ancillary and laboratory) are listed below for reference.    LAB RESULTS: Basic Metabolic Panel:  Lab 02/06/12 4098 02/05/12 2143  NA 141 139  K 4.4 3.6  CL 106 100  CO2 28 27  GLUCOSE 100* 111*  BUN 20 19  CREATININE 1.06 1.12  CALCIUM 9.0 9.4  MG -- --  PHOS -- --   CBC:  Lab 02/06/12 0143 02/05/12 2143  WBC 8.9 8.2  NEUTROABS -- 3.4  HGB 13.0 12.9*  HCT 37.9* 37.6*  MCV 87.7 --  PLT 286 298   Cardiac Enzymes:  Lab 02/06/12 1349 02/06/12 0857  CKTOTAL 202 235*  CKMB 2.3 2.4  CKMBINDEX -- --  TROPONINI <0.30 <0.30      Disposition and Follow-up: Discharge Orders    Future Orders Please Complete By Expires   Diet - low sodium heart healthy      Increase activity slowly          DISPOSITION: home   DIET:heart healthy diet  ACTIVITY: as tolerated  DISCHARGE FOLLOW-UP Follow-up Information    Follow up with [name], MD. Schedule an appointment as soon as possible for a visit in 10 days. [facility] FOLLOW-UP)    Contact information:   [address] [facility] [place] [address] [phone]          Time spent on Discharge: 45 minutes  Signed:  Blaine Hari M.D. [facility] 02/06/2012, 4:14 PM

## 2012-02-06 NOTE — Progress Notes (Signed)
Discharge instructions given to and reviewed with patient who verbalizes understanding, patient reports chronic neck pain level 3/10.  Prescriptions given, skin intact.  Patient discharged home with family.  Shawn Potts

## 2012-02-06 NOTE — H&P (Signed)
PCP:   Karn Pickler, MD, MD   Chief Complaint: Intermittent chest pain for 3 weeks   HPI: Shawn Potts is an 46 y.o. male with history of seizure disorder, hypertension, diabetes, chronic neck pain on narcotic, tobacco abuse, family history of premature coronary disease, presents to the emergency room complaining of intermittent substernal chest pain for the past 3 weeks. He denied any exertional chest pain. Tonight, while talking on the phone with his brother, developed substernal chest pain along with diaphoresis. Evaluation in emergency room included an EKG with nonspecific ST-T changes, negative cardiac markers, negative chest x-ray, and unremarkable serological studies. Hospitalists was asked to admit him for cardiac rule out.  Rewiew of Systems:  The patient denies anorexia, fever, weight loss,, vision loss, decreased hearing, hoarseness, syncope, dyspnea on exertion, peripheral edema, balance deficits, hemoptysis, abdominal pain, melena, hematochezia, severe indigestion/heartburn, hematuria, incontinence, genital sores, muscle weakness, suspicious skin lesions, transient blindness, difficulty walking, depression, unusual weight change, abnormal bleeding, enlarged lymph nodes, angioedema, and breast masses.   Past Medical History  Diagnosis Date  . Hypertension   . Diabetes mellitus   . Seizures     Past Surgical History  Procedure Date  . Brain surgery   . Brain surgery     Medications:  HOME MEDS: Prior to Admission medications   Medication Sig Start Date End Date Taking? Authorizing Provider  amLODipine (NORVASC) 5 MG tablet Take 10 mg by mouth daily.   Yes Historical Provider, MD  carbamazepine (TEGRETOL XR) 400 MG 12 hr tablet Take 800 mg by mouth 2 (two) times daily.   Yes Historical Provider, MD  cloNIDine (CATAPRES) 0.3 MG tablet Take 0.3 mg by mouth 2 (two) times daily.   Yes Historical Provider, MD  hydrALAZINE (APRESOLINE) 10 MG tablet Take 10 mg by  mouth 3 (three) times daily.   Yes Historical Provider, MD  lisinopril-hydrochlorothiazide (PRINZIDE,ZESTORETIC) 20-12.5 MG per tablet Take 2 tablets by mouth daily.   Yes Historical Provider, MD  metoprolol succinate (TOPROL-XL) 100 MG 24 hr tablet Take 100 mg by mouth daily. Take with or immediately following a meal.   Yes Historical Provider, MD  oxyCODONE-acetaminophen (PERCOCET) 5-325 MG per tablet Take 1 tablet by mouth every 4 (four) hours as needed. For pain   Yes Historical Provider, MD  valACYclovir (VALTREX) 1000 MG tablet Take 2,000 mg by mouth 2 (two) times daily.   Yes Historical Provider, MD     Allergies:  Allergies  Allergen Reactions  . Keppra     Pt states, "When I take Keppra it makes me feel like I am having a heart attack."     Social History:   does not have a smoking history on file. He does not have any smokeless tobacco history on file. His alcohol and drug histories not on file.  Family History: No family history on file.   Physical Exam: Filed Vitals:   02/05/12 2303 02/06/12 0000 02/06/12 0059  BP: 140/79 148/83   Pulse: 64 70   Resp: 15 25   Weight:   76.8 kg (169 lb 5 oz)  SpO2: 98% 98%    Blood pressure 148/83, pulse 70, resp. rate 25, weight 76.8 kg (169 lb 5 oz), SpO2 98.00%.  GEN:  Pleasant  person lying in the stretcher in no acute distress; cooperative with exam PSYCH:  alert and oriented x4; does not appear anxious or depressed; affect is appropriate. HEENT: Mucous membranes pink and anicteric; PERRLA; EOM intact; no cervical lymphadenopathy nor thyromegaly  or carotid bruit; no JVD; Breasts:: Not examined CHEST WALL: No tenderness CHEST: Normal respiration, clear to auscultation bilaterally HEART: Regular rate and rhythm; no murmurs rubs or gallops BACK: No kyphosis or scoliosis; no CVA tenderness ABDOMEN: Obese, soft non-tender; no masses, no organomegaly, normal abdominal bowel sounds; no pannus; no intertriginous candida. Rectal  Exam: Not done EXTREMITIES: No bone or joint deformity; age-appropriate arthropathy of the hands and knees; no edema; no ulcerations. Genitalia: not examined PULSES: 2+ and symmetric SKIN: Normal hydration no rash or ulceration CNS: Cranial nerves 2-12 grossly intact no focal lateralizing neurologic deficit   Labs & Imaging Results for orders placed during the hospital encounter of 02/05/12 (from the past 48 hour(s))  CBC     Status: Abnormal   Collection Time   02/05/12  9:43 PM      Component Value Range Comment   WBC 8.2  4.0 - 10.5 (K/uL)    RBC 4.32  4.22 - 5.81 (MIL/uL)    Hemoglobin 12.9 (*) 13.0 - 17.0 (g/dL)    HCT 69.6 (*) 29.5 - 52.0 (%)    MCV 87.0  78.0 - 100.0 (fL)    MCH 29.9  26.0 - 34.0 (pg)    MCHC 34.3  30.0 - 36.0 (g/dL)    RDW 28.4  13.2 - 44.0 (%)    Platelets 298  150 - 400 (K/uL)   DIFFERENTIAL     Status: Abnormal   Collection Time   02/05/12  9:43 PM      Component Value Range Comment   Neutrophils Relative 41 (*) 43 - 77 (%)    Neutro Abs 3.4  1.7 - 7.7 (K/uL)    Lymphocytes Relative 50 (*) 12 - 46 (%)    Lymphs Abs 4.1 (*) 0.7 - 4.0 (K/uL)    Monocytes Relative 6  3 - 12 (%)    Monocytes Absolute 0.5  0.1 - 1.0 (K/uL)    Eosinophils Relative 3  0 - 5 (%)    Eosinophils Absolute 0.2  0.0 - 0.7 (K/uL)    Basophils Relative 0  0 - 1 (%)    Basophils Absolute 0.0  0.0 - 0.1 (K/uL)   BASIC METABOLIC PANEL     Status: Abnormal   Collection Time   02/05/12  9:43 PM      Component Value Range Comment   Sodium 139  135 - 145 (mEq/L)    Potassium 3.6  3.5 - 5.1 (mEq/L)    Chloride 100  96 - 112 (mEq/L)    CO2 27  19 - 32 (mEq/L)    Glucose, Bld 111 (*) 70 - 99 (mg/dL)    BUN 19  6 - 23 (mg/dL)    Creatinine, Ser 1.02  0.50 - 1.35 (mg/dL)    Calcium 9.4  8.4 - 10.5 (mg/dL)    GFR calc non Af Amer 77 (*) >90 (mL/min)    GFR calc Af Amer 89 (*) >90 (mL/min)   POCT I-STAT TROPONIN I     Status: Normal   Collection Time   02/05/12  9:55 PM      Component  Value Range Comment   Troponin i, poc 0.03  0.00 - 0.08 (ng/mL)    Comment 3             Dg Chest 2 View  02/05/2012  *RADIOLOGY REPORT*  Clinical Data: Headache, shortness of breath, chest pains.  CHEST - 2 VIEW  Comparison: None.  Findings: Borderline heart size with  normal pulmonary vascularity. No focal airspace consolidation in the lungs.  No blunting of costophrenic angles.  No pneumothorax.  Postoperative changes in the cervical spine. Widening of the right acromioclavicular joint space suggesting prior resection or resorption.  IMPRESSION: No evidence of active pulmonary disease.  Original Report Authenticated By: Marlon Pel, M.D.      Assessment Present on Admission:  .Chest pain .HTN (hypertension) .DM (diabetes mellitus) .Seizure disorder .Tobacco abuse   PLAN:  46 year old presents with atypical chest pain, but significant increased cardiac risk factors. Is possible that he developed unstable angina. Is reasonable to give him Lovenox while ruling him out. He'll get serial CPKs and troponins, cardiac echo, and in serious need of cardiac risk modifications. Once ruled out, he should get a stress test as well. I strongly encouraged to stop smoking, exercise regularly, and eat healthy. It looks as if his blood glucose is in good control. I will put him on cardiac and carbohydrate modified diet. He is stable, full code, and will be admitted to [facility] service.  Other plans as per orders.    Shawn Potts 02/06/2012, 1:21 AM

## 2012-02-06 NOTE — Progress Notes (Signed)
   CARE MANAGEMENT NOTE 02/06/2012  Patient:  Shawn Potts, Shawn Potts   Account Number:  [id number]  Date Initiated:  02/06/2012  Documentation initiated by:  Darlyne Russian  Subjective/Objective Assessment:   Patient admitted with chest pain     Action/Plan:   Patient lives at home with spouse   Anticipated DC Date:  02/07/2012   Anticipated DC Plan:  HOME/SELF CARE      DC Planning Services  CM consult      Choice offered to / List presented to:             Status of service:  In process, will continue to follow Medicare Important Message given?   (If response is "NO", the following Medicare IM given date fields will be blank) Date Medicare IM given:   Date Additional Medicare IM given:    Discharge Disposition:    Per UR Regulation:  Reviewed for med. necessity/level of care/duration of stay  If discussed at Long Length of Stay Meetings, dates discussed:    Comments:  PCP: Dr Hyacinth Meeker  Met with patient to discuss discharge planning.  02/06/12 Shawn Potts, BSN  1222 UR COMPLETED

## 2012-02-06 NOTE — Progress Notes (Signed)
  Echocardiogram 2D Echocardiogram has been performed.  Shawn Potts A 02/06/2012, 4:21 PM

## 2012-02-06 NOTE — Progress Notes (Signed)
   CARE MANAGEMENT NOTE 02/06/2012  Patient:  Shawn Potts, Shawn Potts   Account Number:  [id number]  Date Initiated:  02/06/2012  Documentation initiated by:  Darlyne Russian  Subjective/Objective Assessment:   Patient admitted with chest pain     Action/Plan:   Patient lives at home with spouse   Anticipated DC Date:  02/07/2012   Anticipated DC Plan:  HOME/SELF CARE      DC Planning Services  CM consult      Choice offered to / List presented to:             Status of service:   Medicare Important Message given?   (If response is "NO", the following Medicare IM given date fields will be blank) Date Medicare IM given:   Date Additional Medicare IM given:    Discharge Disposition:    Per UR Regulation:    If discussed at Long Length of Stay Meetings, dates discussed:    Comments:  PCP: Dr Hyacinth Meeker  Met with patient to discuss discharge planning.

## 2012-02-06 NOTE — Consult Note (Signed)
Reason for Consult: Chest pain Referring Physician: [facility] hospitalist  Shawn Potts is an 46 y.o. male.  HPI: Patient is 46 year old male with past medical history significant for hypertension glucose intolerance hypercholesteremia history of benign brain tumor resection  History of seizure disorder positive family history of coronary artery disease mother died in her 37s due to myocardial infarction tobacco abuse was admitted last night due to recurrent retrosternal chest pain described as dull aching and also weight on the chest grade 9/10 radiating to neck and left arm associated with diaphoresis and mild shortness of breath. EKG done in the ER showed a normal sinus rhythm with LVH with early the port as addition changes and nonspecific T-wave changes. Her 2 sets of cardiac markers are negative. Patient denies any palpitation lightheadedness or syncope denies history of PND orthopnea leg swelling denies history of exertional chest pain although his activity is limited. Patient denies any relation of chest pain to food breathing or movement. Patient received a sublingual nitroglycerin in ED with the relief of chest pain.  Past Medical History  Diagnosis Date  . Hypertension   . Diabetes mellitus   . Seizures     Past Surgical History  Procedure Date  . Brain surgery   . Brain surgery     No family history on file.  Social History:  reports that he has been smoking.  He does not have any smokeless tobacco history on file. He reports that he drinks alcohol. He reports that he does not use illicit drugs.  Allergies:  Allergies  Allergen Reactions  . Keppra     Pt states, "When I take Keppra it makes me feel like I am having a heart attack."     Medications: I have reviewed the patient's current medications.  Results for orders placed during the hospital encounter of 02/05/12 (from the past 48 hour(s))  CBC     Status: Abnormal   Collection Time   02/05/12  9:43 PM   Component Value Range Comment   WBC 8.2  4.0 - 10.5 (K/uL)    RBC 4.32  4.22 - 5.81 (MIL/uL)    Hemoglobin 12.9 (*) 13.0 - 17.0 (g/dL)    HCT 78.4 (*) 69.6 - 52.0 (%)    MCV 87.0  78.0 - 100.0 (fL)    MCH 29.9  26.0 - 34.0 (pg)    MCHC 34.3  30.0 - 36.0 (g/dL)    RDW 29.5  28.4 - 13.2 (%)    Platelets 298  150 - 400 (K/uL)   DIFFERENTIAL     Status: Abnormal   Collection Time   02/05/12  9:43 PM      Component Value Range Comment   Neutrophils Relative 41 (*) 43 - 77 (%)    Neutro Abs 3.4  1.7 - 7.7 (K/uL)    Lymphocytes Relative 50 (*) 12 - 46 (%)    Lymphs Abs 4.1 (*) 0.7 - 4.0 (K/uL)    Monocytes Relative 6  3 - 12 (%)    Monocytes Absolute 0.5  0.1 - 1.0 (K/uL)    Eosinophils Relative 3  0 - 5 (%)    Eosinophils Absolute 0.2  0.0 - 0.7 (K/uL)    Basophils Relative 0  0 - 1 (%)    Basophils Absolute 0.0  0.0 - 0.1 (K/uL)   BASIC METABOLIC PANEL     Status: Abnormal   Collection Time   02/05/12  9:43 PM      Component Value Range  Comment   Sodium 139  135 - 145 (mEq/L)    Potassium 3.6  3.5 - 5.1 (mEq/L)    Chloride 100  96 - 112 (mEq/L)    CO2 27  19 - 32 (mEq/L)    Glucose, Bld 111 (*) 70 - 99 (mg/dL)    BUN 19  6 - 23 (mg/dL)    Creatinine, Ser 1.61  0.50 - 1.35 (mg/dL)    Calcium 9.4  8.4 - 10.5 (mg/dL)    GFR calc non Af Amer 77 (*) >90 (mL/min)    GFR calc Af Amer 89 (*) >90 (mL/min)   POCT I-STAT TROPONIN I     Status: Normal   Collection Time   02/05/12  9:55 PM      Component Value Range Comment   Troponin i, poc 0.03  0.00 - 0.08 (ng/mL)    Comment 3            CARDIAC PANEL(CRET KIN+CKTOT+MB+TROPI)     Status: Abnormal   Collection Time   02/06/12  1:30 AM      Component Value Range Comment   Total CK 291 (*) 7 - 232 (U/L)    CK, MB 2.5  0.3 - 4.0 (ng/mL)    Troponin I <0.30  <0.30 (ng/mL)    Relative Index 0.9  0.0 - 2.5    BASIC METABOLIC PANEL     Status: Abnormal   Collection Time   02/06/12  1:43 AM      Component Value Range Comment   Sodium 141   135 - 145 (mEq/L)    Potassium 4.4  3.5 - 5.1 (mEq/L)    Chloride 106  96 - 112 (mEq/L)    CO2 28  19 - 32 (mEq/L)    Glucose, Bld 100 (*) 70 - 99 (mg/dL)    BUN 20  6 - 23 (mg/dL)    Creatinine, Ser 0.96  0.50 - 1.35 (mg/dL)    Calcium 9.0  8.4 - 10.5 (mg/dL)    GFR calc non Af Amer 82 (*) >90 (mL/min)    GFR calc Af Amer >90  >90 (mL/min)   CBC     Status: Abnormal   Collection Time   02/06/12  1:43 AM      Component Value Range Comment   WBC 8.9  4.0 - 10.5 (K/uL)    RBC 4.32  4.22 - 5.81 (MIL/uL)    Hemoglobin 13.0  13.0 - 17.0 (g/dL)    HCT 04.5 (*) 40.9 - 52.0 (%)    MCV 87.7  78.0 - 100.0 (fL)    MCH 30.1  26.0 - 34.0 (pg)    MCHC 34.3  30.0 - 36.0 (g/dL)    RDW 81.1  91.4 - 78.2 (%)    Platelets 286  150 - 400 (K/uL)   TSH     Status: Normal   Collection Time   02/06/12  1:43 AM      Component Value Range Comment   TSH 4.248  0.350 - 4.500 (uIU/mL)   LIPID PANEL     Status: Abnormal   Collection Time   02/06/12  1:43 AM      Component Value Range Comment   Cholesterol 205 (*) 0 - 200 (mg/dL)    Triglycerides 956 (*) <150 (mg/dL)    HDL 35 (*) >21 (mg/dL)    Total CHOL/HDL Ratio 5.9      VLDL 36  0 - 40 (mg/dL)    LDL Cholesterol 308 (*)  0 - 99 (mg/dL)   CARDIAC PANEL(CRET KIN+CKTOT+MB+TROPI)     Status: Abnormal   Collection Time   02/06/12  8:57 AM      Component Value Range Comment   Total CK 235 (*) 7 - 232 (U/L)    CK, MB 2.4  0.3 - 4.0 (ng/mL)    Troponin I <0.30  <0.30 (ng/mL)    Relative Index 1.0  0.0 - 2.5      Dg Chest 2 View  02/05/2012  *RADIOLOGY REPORT*  Clinical Data: Headache, shortness of breath, chest pains.  CHEST - 2 VIEW  Comparison: None.  Findings: Borderline heart size with normal pulmonary vascularity. No focal airspace consolidation in the lungs.  No blunting of costophrenic angles.  No pneumothorax.  Postoperative changes in the cervical spine. Widening of the right acromioclavicular joint space suggesting prior resection or resorption.   IMPRESSION: No evidence of active pulmonary disease.  Original Report Authenticated By: Marlon Pel, M.D.    Review of Systems  Constitutional: Negative for fever and chills.  HENT: Negative for hearing loss and tinnitus.   Eyes: Negative for blurred vision, double vision and photophobia.  Respiratory: Negative for cough, hemoptysis and sputum production.   Cardiovascular: Positive for chest pain. Negative for orthopnea, claudication and leg swelling.  Gastrointestinal: Positive for vomiting. Negative for heartburn, nausea and abdominal pain.  Skin: Negative for rash.  Neurological: Positive for headaches. Negative for dizziness.  Psychiatric/Behavioral: Negative for depression.   Blood pressure 129/70, pulse 57, temperature 97.4 F (36.3 C), resp. rate 18, height 5\' 8"  (1.727 m), weight 73.9 kg (162 lb 14.7 oz), SpO2 94.00%. Physical Exam  Constitutional: He is oriented to person, place, and time. He appears well-developed.  HENT:  Head: Normocephalic and atraumatic.  Nose: Nose normal.  Mouth/Throat: No oropharyngeal exudate.  Eyes: Conjunctivae are normal. Left eye exhibits no discharge. No scleral icterus.  Neck: Normal range of motion. Neck supple. No JVD present. No tracheal deviation present. No thyromegaly present.  Cardiovascular: Normal rate, regular rhythm and normal heart sounds.  Exam reveals no gallop.   No murmur heard. Respiratory: Breath sounds normal. No respiratory distress. He has no wheezes. He has no rales.  GI: Soft. Bowel sounds are normal. He exhibits no distension and no mass. There is no tenderness. There is no rebound and no guarding.  Musculoskeletal: He exhibits no edema and no tenderness.  Lymphadenopathy:    He has no cervical adenopathy.  Neurological: He is alert and oriented to person, place, and time.    Assessment/Plan: Status post chest pain MI ruled out worrisome for angina rule out coronary insufficiency Hypertension Glucose  intolerance Hypercholesteremia Positive family history of coronary artery disease Tobacco abuse History of benign brain tumor Seizure disorder Plan Continue present management Scheduled for nuclear stress test today Smoking cessation consult  Shawn Potts 02/06/2012, 12:14 PM

## 2012-02-07 NOTE — Progress Notes (Signed)
   CARE MANAGEMENT NOTE 02/07/2012  Patient:  Shawn Potts, Shawn Potts   Account Number:  [id number]  Date Initiated:  02/06/2012  Documentation initiated by:  Darlyne Russian  Subjective/Objective Assessment:   Patient admitted with chest pain     Action/Plan:   Patient lives at home with spouse   Anticipated DC Date:  02/07/2012   Anticipated DC Plan:  HOME/SELF CARE      DC Planning Services  CM consult      Choice offered to / List presented to:             Status of service:  Completed, signed off Medicare Important Message given?   (If response is "NO", the following Medicare IM given date fields will be blank) Date Medicare IM given:   Date Additional Medicare IM given:    Discharge Disposition:  HOME/SELF CARE  Per UR Regulation:  Reviewed for med. necessity/level of care/duration of stay  If discussed at Long Length of Stay Meetings, dates discussed:    Comments:  PCP: Dr Hyacinth Meeker  Met with patient to discuss discharge planning.  02/06/12 Roger Kill, BSN  1222 UR COMPLETED

## 2012-02-09 LAB — CARBAMAZEPINE, FREE AND TOTAL

## 2012-02-13 NOTE — ED Provider Notes (Signed)
Medical screening examination/treatment/procedure(s) were performed by non-physician practitioner and as supervising physician I was immediately available for consultation/collaboration.  Neave Lenger, MD 02/13/12 1458 

## 2012-06-26 ENCOUNTER — Encounter (HOSPITAL_COMMUNITY): Payer: Self-pay

## 2012-06-26 ENCOUNTER — Emergency Department (HOSPITAL_COMMUNITY)
Admission: EM | Admit: 2012-06-26 | Discharge: 2012-06-27 | Disposition: A | Discharge status: Home / Self Care | Payer: Medicare Other | Attending: Emergency Medicine | Admitting: Emergency Medicine

## 2012-06-26 ENCOUNTER — Emergency Department (HOSPITAL_COMMUNITY): Payer: Medicare Other

## 2012-06-26 DIAGNOSIS — R079 Chest pain, unspecified: Secondary | ICD-10-CM | POA: Insufficient documentation

## 2012-06-26 DIAGNOSIS — Z7982 Long term (current) use of aspirin: Secondary | ICD-10-CM | POA: Insufficient documentation

## 2012-06-26 DIAGNOSIS — F17200 Nicotine dependence, unspecified, uncomplicated: Secondary | ICD-10-CM | POA: Insufficient documentation

## 2012-06-26 DIAGNOSIS — Z79899 Other long term (current) drug therapy: Secondary | ICD-10-CM | POA: Insufficient documentation

## 2012-06-26 DIAGNOSIS — I10 Essential (primary) hypertension: Secondary | ICD-10-CM | POA: Insufficient documentation

## 2012-06-26 LAB — CBC WITH DIFFERENTIAL/PLATELET
Basophils Relative: 0 % (ref 0–1)
Eosinophils Absolute: 0.2 10*3/uL (ref 0.0–0.7)
Eosinophils Relative: 3 % (ref 0–5)
MCH: 29.3 pg (ref 26.0–34.0)
MCHC: 34.1 g/dL (ref 30.0–36.0)
MCV: 86 fL (ref 78.0–100.0)
Monocytes Relative: 7 % (ref 3–12)
Neutrophils Relative %: 43 % (ref 43–77)
Platelets: 304 10*3/uL (ref 150–400)

## 2012-06-26 LAB — POCT I-STAT, CHEM 8
Glucose, Bld: 84 mg/dL (ref 70–99)
HCT: 35 % — ABNORMAL LOW (ref 39.0–52.0)
Hemoglobin: 11.9 g/dL — ABNORMAL LOW (ref 13.0–17.0)
Potassium: 3.4 mEq/L — ABNORMAL LOW (ref 3.5–5.1)
Sodium: 141 mEq/L (ref 135–145)
TCO2: 26 mmol/L (ref 0–100)

## 2012-06-26 MED ORDER — HYDROMORPHONE HCL PF 1 MG/ML IJ SOLN
0.5000 mg | Freq: Once | INTRAMUSCULAR | Status: AC
  Administered 2012-06-26: 0.5 mg via INTRAVENOUS
  Filled 2012-06-26: qty 1

## 2012-06-26 NOTE — ED Notes (Signed)
ED lab tech had not been given istat labs.  Labs located and placed in his hands.

## 2012-06-26 NOTE — ED Notes (Addendum)
Pt has had chest pain for 4 days on and off, while sitting on the porch tonight patient had severe pain to his chest (left sided) that radiates down his arm. Pt states that he had episodes of N/V/D several days ago. Pain currently 5/10. Pt received 2 SL nitro with EMS which helped pain and 324 of chewable aspirin. Pt c/o severe headache at this time.

## 2012-06-26 NOTE — ED Notes (Signed)
Patient is resting comfortably. 
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
## 2012-06-26 NOTE — ED Notes (Signed)
Report given to Brenda, RN

## 2012-06-26 NOTE — ED Notes (Signed)
Family at bedside. 

## 2012-06-27 NOTE — ED Notes (Signed)
Pt states dilaudid improving headache and chest pain.  Pain 6/10.

## 2012-06-27 NOTE — ED Provider Notes (Signed)
Medical screening examination/treatment/procedure(s) were performed by non-physician practitioner and as supervising physician I was immediately available for consultation/collaboration.   Richardean Canal, MD 06/27/12 1218

## 2012-06-27 NOTE — ED Notes (Signed)
Pt states chest pain 3/10 and headache 7/10

## 2012-06-27 NOTE — ED Notes (Signed)
Pt states he does not want to stay in ED.   Encouraged to stay by PA, but pt does not want to stay.

## 2012-10-24 ENCOUNTER — Emergency Department (HOSPITAL_COMMUNITY)
Admission: EM | Admit: 2012-10-24 | Discharge: 2012-10-25 | Disposition: A | Discharge status: Home / Self Care | Payer: Medicare Other | Attending: Emergency Medicine | Admitting: Emergency Medicine

## 2012-10-24 ENCOUNTER — Emergency Department (HOSPITAL_COMMUNITY): Payer: Medicare Other

## 2012-10-24 ENCOUNTER — Encounter (HOSPITAL_COMMUNITY): Payer: Self-pay | Admitting: Emergency Medicine

## 2012-10-24 DIAGNOSIS — G40909 Epilepsy, unspecified, not intractable, without status epilepticus: Secondary | ICD-10-CM | POA: Insufficient documentation

## 2012-10-24 DIAGNOSIS — S1093XA Contusion of unspecified part of neck, initial encounter: Secondary | ICD-10-CM | POA: Insufficient documentation

## 2012-10-24 DIAGNOSIS — Z7982 Long term (current) use of aspirin: Secondary | ICD-10-CM | POA: Insufficient documentation

## 2012-10-24 DIAGNOSIS — I10 Essential (primary) hypertension: Secondary | ICD-10-CM | POA: Insufficient documentation

## 2012-10-24 DIAGNOSIS — S0003XA Contusion of scalp, initial encounter: Secondary | ICD-10-CM | POA: Insufficient documentation

## 2012-10-24 DIAGNOSIS — M542 Cervicalgia: Secondary | ICD-10-CM | POA: Insufficient documentation

## 2012-10-24 DIAGNOSIS — F17200 Nicotine dependence, unspecified, uncomplicated: Secondary | ICD-10-CM | POA: Insufficient documentation

## 2012-10-24 DIAGNOSIS — Z79899 Other long term (current) drug therapy: Secondary | ICD-10-CM | POA: Insufficient documentation

## 2012-10-24 MED ORDER — OXYCODONE-ACETAMINOPHEN 5-325 MG PO TABS
1.0000 | ORAL_TABLET | Freq: Once | ORAL | Status: AC
  Administered 2012-10-24: 1 via ORAL
  Filled 2012-10-24: qty 1

## 2012-10-24 NOTE — ED Notes (Signed)
Patient transported to CT 
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
## 2012-10-24 NOTE — ED Notes (Signed)
Pt back from CT

## 2012-10-25 MED ORDER — HYDROCODONE-ACETAMINOPHEN 5-325 MG PO TABS: 1.0000 | ORAL_TABLET | ORAL | Status: DC | PRN

## 2012-10-25 NOTE — ED Provider Notes (Signed)
Medical screening examination/treatment/procedure(s) were performed by non-physician practitioner and as supervising physician I was immediately available for consultation/collaboration.   Charles B. Sheldon, MD 10/25/12 1504 

## 2012-12-18 ENCOUNTER — Encounter (HOSPITAL_COMMUNITY): Payer: Self-pay | Admitting: Emergency Medicine

## 2012-12-18 ENCOUNTER — Emergency Department (HOSPITAL_COMMUNITY): Payer: Medicare Other

## 2012-12-18 ENCOUNTER — Emergency Department (HOSPITAL_COMMUNITY)
Admission: EM | Admit: 2012-12-18 | Discharge: 2012-12-18 | Disposition: A | Discharge status: Home / Self Care | Payer: Medicare Other | Attending: Emergency Medicine | Admitting: Emergency Medicine

## 2012-12-18 DIAGNOSIS — I10 Essential (primary) hypertension: Secondary | ICD-10-CM | POA: Insufficient documentation

## 2012-12-18 DIAGNOSIS — G40909 Epilepsy, unspecified, not intractable, without status epilepticus: Secondary | ICD-10-CM | POA: Insufficient documentation

## 2012-12-18 DIAGNOSIS — R0602 Shortness of breath: Secondary | ICD-10-CM | POA: Insufficient documentation

## 2012-12-18 DIAGNOSIS — R51 Headache: Secondary | ICD-10-CM | POA: Insufficient documentation

## 2012-12-18 DIAGNOSIS — F17200 Nicotine dependence, unspecified, uncomplicated: Secondary | ICD-10-CM | POA: Insufficient documentation

## 2012-12-18 DIAGNOSIS — Z79899 Other long term (current) drug therapy: Secondary | ICD-10-CM | POA: Insufficient documentation

## 2012-12-18 MED ORDER — OXYCODONE-ACETAMINOPHEN 5-325 MG PO TABS
2.0000 | ORAL_TABLET | Freq: Once | ORAL | Status: AC
  Administered 2012-12-18: 2 via ORAL
  Filled 2012-12-18: qty 2

## 2012-12-18 NOTE — ED Notes (Addendum)
Pt states "I've had a headache for months." Pt states he has had multiple brain surgeries "and I think that there is brain tissue coming out of the cracks in my skull." Pt states "It also feels like I'm drowning in my chest, like I'm breathing weird." No respiratory issues noted. Pt has left sided pronator drift, pt states 'Since my headache started, my left side is weaker."
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
## 2012-12-18 NOTE — ED Provider Notes (Signed)
History     CSN: [id number]  Arrival date & time 12/18/12  1027   First MD Initiated Contact with Patient 12/18/12 1234      Chief Complaint  Patient presents with  . Headache  . Shortness of Breath    (Consider location/radiation/quality/duration/timing/severity/associated sxs/prior treatment) HPI  Patient states right lateral aspect of head hurts for one months.  It is sharp and tender to palpation.  It is over area where previous craniotomy and he thinks he can feel his brains pushing out.  He has had no recent trauma, fever, chills, weakness, or numbness.    Past Medical History  Diagnosis Date  . Hypertension   . Seizures     Past Surgical History  Procedure Laterality Date  . Brain surgery  x 4  . Brain surgery      No family history on file.  History  Substance Use Topics  . Smoking status: Current Every Day Smoker -- 1.00 packs/day  . Smokeless tobacco: Not on file  . Alcohol Use: No      Review of Systems  All other systems reviewed and are negative.    Allergies  Levetiracetam  Home Medications   Current Outpatient Rx  Name  Route  Sig  Dispense  Refill  . amLODipine (NORVASC) 5 MG tablet   Oral   Take 10 mg by mouth daily.         . benazepril-hydrochlorthiazide (LOTENSIN HCT) 20-25 MG per tablet   Oral   Take 1 tablet by mouth daily.         . carbamazepine (TEGRETOL XR) 400 MG 12 hr tablet   Oral   Take 800 mg by mouth 2 (two) times daily.         . meloxicam (MOBIC) 15 MG tablet   Oral   Take 15 mg by mouth daily.         . metoprolol succinate (TOPROL-XL) 100 MG 24 hr tablet   Oral   Take 100 mg by mouth daily. Take with or immediately following a meal.         . oxyCODONE-acetaminophen (PERCOCET) 10-325 MG per tablet   Oral   Take 1 tablet by mouth every 4 (four) hours as needed for pain.           BP 153/90  Pulse 53  Temp(Src) 97.5 F (36.4 C) (Oral)  Resp 18  SpO2 99%  Physical Exam  Nursing note  and vitals reviewed. Constitutional: He is oriented to person, place, and time. He appears well-developed and well-nourished.  HENT:  Head: Normocephalic and atraumatic.  Tender to palpationg right frontal scalp area, no redness warmth or palpable soft tissue or visible brain matter.   Eyes: Conjunctivae and EOM are normal. Pupils are equal, round, and reactive to light.  Neck: Normal range of motion. Neck supple.  Cardiovascular: Normal rate and regular rhythm.   Pulmonary/Chest: Effort normal and breath sounds normal.  Abdominal: Soft. Bowel sounds are normal.  Musculoskeletal: Normal range of motion.  Neurological: He is alert and oriented to person, place, and time. He has normal strength and normal reflexes. A sensory deficit is present. He displays a negative Romberg sign. GCS eye subscore is 4. GCS verbal subscore is 5. GCS motor subscore is 6.  Skin: Skin is warm and dry.  Psychiatric: He has a normal mood and affect.    ED Course  Procedures (including critical care time)  Labs Reviewed  CBC WITH DIFFERENTIAL  BASIC  METABOLIC PANEL   Dg Chest 2 View  12/18/2012  *RADIOLOGY REPORT*  Clinical Data: Headache, shortness of breath  CHEST - 2 VIEW  Comparison: 06/26/2012  Findings: Cardiomediastinal silhouette is stable.  Metallic fixation plate cervical spine again noted.  No acute infiltrate or pleural effusion.  No pulmonary edema.  Bony thorax is unremarkable.  IMPRESSION: No active disease.  No significant change.   Original Report Authenticated By: Natasha Mead, M.D.      No diagnosis found.   Patient's ct from 10/24/12 reviewed and patient with encephelomalacia and right scalp hematoma on that ct.  No new trauma or injury. MDM  Patient advised follow up with pmd and use tylenol or ibuprofen for pain.         Hilario Quarry, MD 12/18/12 [phone]

## 2012-12-18 NOTE — ED Notes (Signed)
I gave the patient a warm blanket. 

## 2013-12-20 ENCOUNTER — Emergency Department (HOSPITAL_COMMUNITY)
Admission: EM | Admit: 2013-12-20 | Discharge: 2013-12-20 | Disposition: A | Discharge status: Home / Self Care | Payer: Medicare Other | Attending: Emergency Medicine | Admitting: Emergency Medicine

## 2013-12-20 ENCOUNTER — Encounter (HOSPITAL_COMMUNITY): Payer: Self-pay | Admitting: Emergency Medicine

## 2013-12-20 DIAGNOSIS — I10 Essential (primary) hypertension: Secondary | ICD-10-CM | POA: Insufficient documentation

## 2013-12-20 DIAGNOSIS — L0291 Cutaneous abscess, unspecified: Secondary | ICD-10-CM

## 2013-12-20 DIAGNOSIS — Z79899 Other long term (current) drug therapy: Secondary | ICD-10-CM | POA: Insufficient documentation

## 2013-12-20 DIAGNOSIS — G40909 Epilepsy, unspecified, not intractable, without status epilepticus: Secondary | ICD-10-CM | POA: Insufficient documentation

## 2013-12-20 DIAGNOSIS — K651 Peritoneal abscess: Secondary | ICD-10-CM | POA: Insufficient documentation

## 2013-12-20 DIAGNOSIS — F17200 Nicotine dependence, unspecified, uncomplicated: Secondary | ICD-10-CM | POA: Insufficient documentation

## 2013-12-20 DIAGNOSIS — Z791 Long term (current) use of non-steroidal anti-inflammatories (NSAID): Secondary | ICD-10-CM | POA: Insufficient documentation

## 2013-12-20 NOTE — ED Notes (Signed)
C/o bump to lower abd/pelvic area since Tuesday.  Reports white discharge today.

## 2013-12-20 NOTE — Discharge Instructions (Signed)
Please read and follow all provided instructions.  Your diagnoses today include:  1. Abscess     Tests performed today include:  Vital signs. See below for your results today.   Medications prescribed:   None  Take any prescribed medications only as directed.   Home care instructions:   Follow any educational materials contained in this packet  Follow-up instructions: Return to the Emergency Department in 48 hours for a recheck if your symptoms are not significantly improved.  Please follow-up with your primary care provider in the next 1 week for further evaluation of your symptoms. If you do not have a primary care doctor -- see below for referral information.   Return instructions:  Return to the Emergency Department if you have:  Fever  Worsening symptoms  Worsening pain  Worsening swelling  Redness of the skin that moves away from the affected area, especially if it streaks away from the affected area   Any other emergent concerns  Your vital signs today were: BP 152/93   Pulse 55   Temp(Src) 98.1 F (36.7 C) (Oral)   Resp 18   SpO2 98% If your blood pressure (BP) was elevated above 135/85 this visit, please have this repeated by your doctor within one month. --------------

## 2013-12-20 NOTE — ED Provider Notes (Signed)
CSN: [id number]     Arrival date & time 12/20/13  1916 History  This chart was scribed for non-physician practitioner Rhea BleacherJosh Merci Walthers, PA-C working with Layla MawKristen N Ward, DO by Joaquin MusicKristina Sanchez-Matthews, ED Scribe. This patient was seen in room TR06C/TR06C and the patient's care was started at 8:27 PM .   Chief Complaint  Patient presents with  . Abscess   Patient is a 48 y.o. male presenting with abscess. The history is provided by the patient. No language interpreter was used.  Abscess Associated symptoms: no fever, no nausea and no vomiting    HPI Comments: Jossie NgJonathan Lehnert is a 48 y.o. male with a hx of abscesses who presents to the Emergency Department complaining of potential abscess with white discharge to lower abd/pelvic area that began 4 days ago. He states he had a bandage over the area and reports having drainage today after getting out the shower. He states he generally takes Percocet 10mg  and states he was advised, by PCP, to stop taking pain medication until being seen at ED. Pt denies hx of DM. Pt denies fever.   Past Medical History  Diagnosis Date  . Hypertension   . Seizures    Past Surgical History  Procedure Laterality Date  . Brain surgery  x 4  . Brain surgery     No family history on file. History  Substance Use Topics  . Smoking status: Current Every Day Smoker -- 1.00 packs/day  . Smokeless tobacco: Not on file  . Alcohol Use: No    Review of Systems  Constitutional: Negative for fever.  Gastrointestinal: Negative for nausea and vomiting.  Skin: Negative for color change and wound.       Positive for abscess  Hematological: Negative for adenopathy.   Allergies  Levetiracetam  Home Medications   Current Outpatient Rx  Name  Route  Sig  Dispense  Refill  . amLODipine (NORVASC) 5 MG tablet   Oral   Take 10 mg by mouth daily.         . benazepril-hydrochlorthiazide (LOTENSIN HCT) 20-25 MG per tablet   Oral   Take 1 tablet by mouth daily.          . carbamazepine (TEGRETOL XR) 400 MG 12 hr tablet   Oral   Take 800 mg by mouth 2 (two) times daily.         . meloxicam (MOBIC) 15 MG tablet   Oral   Take 15 mg by mouth daily.         . metoprolol succinate (TOPROL-XL) 100 MG 24 hr tablet   Oral   Take 100 mg by mouth daily. Take with or immediately following a meal.         . oxyCODONE-acetaminophen (PERCOCET) 10-325 MG per tablet   Oral   Take 1 tablet by mouth every 4 (four) hours as needed for pain.          BP 152/93  Pulse 55  Temp(Src) 98.1 F (36.7 C) (Oral)  Resp 18  SpO2 98%  Physical Exam  Nursing note and vitals reviewed. Constitutional: He appears well-developed and well-nourished. No distress.  HENT:  Head: Normocephalic and atraumatic.  Eyes: Conjunctivae and EOM are normal.  Neck: Normal range of motion. Neck supple. No tracheal deviation present.  Cardiovascular: Normal rate.   Pulmonary/Chest: Effort normal. No respiratory distress.  Musculoskeletal: Normal range of motion.  Neurological: He is alert.  Skin: Skin is warm and dry.  Small pustule to lower abdomen  without surround cellulitis. No palpable fluctuance. Appears that it has previously drained.  Psychiatric: He has a normal mood and affect. His behavior is normal.    ED Course  Procedures DIAGNOSTIC STUDIES: Oxygen Saturation is 98% on RA, normal by my interpretation.    COORDINATION OF CARE: 8:30 PM-Discussed treatment plan which includes advised pt to place warm compress to area to help drainage from abscess. Advised pt to take pain medication as needed. Pt agreed to plan.   Labs Review Labs Reviewed - No data to display Imaging Review No results found.  EKG Interpretation   None      Vital signs reviewed and are as follows: Filed Vitals:   12/20/13 1927  BP: 152/93  Pulse: 55  Temp: 98.1 F (36.7 C)  Resp: 18   Patient to continue home pain medication, continue warm compresses.   The patient was urged to  return to the Emergency Department urgently with worsening pain, swelling, expanding erythema especially if it streaks away from the affected area, fever, or if they have any other concerns.   The patient was urged to return to the Emergency Department or go to their PCP in 48 hours for wound recheck if the area is not significantly improved.  The patient verbalized understanding and stated agreement with this plan.   MDM   Final diagnoses:  Abscess   Patient with skin abscess that has already drained. No signs of cellulitis is surrounding skin. Continue supportive management. Will d/c to home. No antibiotic therapy is indicated.  I personally performed the services described in this documentation, which was scribed in my presence. The recorded information has been reviewed and is accurate.    Renne Crigler, PA-C 12/20/13 2041

## 2013-12-21 NOTE — ED Provider Notes (Signed)
Medical screening examination/treatment/procedure(s) were performed by non-physician practitioner and as supervising physician I was immediately available for consultation/collaboration.  EKG Interpretation   None         Layla MawKristen N Lurae Hornbrook, DO 12/21/13 0021

## 2015-11-03 ENCOUNTER — Encounter (HOSPITAL_COMMUNITY): Payer: Self-pay | Admitting: Emergency Medicine

## 2015-11-03 ENCOUNTER — Emergency Department (HOSPITAL_COMMUNITY): Payer: Medicare Other

## 2015-11-03 ENCOUNTER — Emergency Department (HOSPITAL_COMMUNITY)
Admission: EM | Admit: 2015-11-03 | Discharge: 2015-11-03 | Disposition: A | Discharge status: Home / Self Care | Payer: Medicare Other | Attending: Emergency Medicine | Admitting: Emergency Medicine

## 2015-11-03 DIAGNOSIS — I10 Essential (primary) hypertension: Secondary | ICD-10-CM | POA: Insufficient documentation

## 2015-11-03 DIAGNOSIS — R197 Diarrhea, unspecified: Secondary | ICD-10-CM | POA: Insufficient documentation

## 2015-11-03 DIAGNOSIS — Z791 Long term (current) use of non-steroidal anti-inflammatories (NSAID): Secondary | ICD-10-CM | POA: Insufficient documentation

## 2015-11-03 DIAGNOSIS — F17210 Nicotine dependence, cigarettes, uncomplicated: Secondary | ICD-10-CM | POA: Diagnosis not present

## 2015-11-03 DIAGNOSIS — G40909 Epilepsy, unspecified, not intractable, without status epilepticus: Secondary | ICD-10-CM | POA: Diagnosis not present

## 2015-11-03 DIAGNOSIS — B9789 Other viral agents as the cause of diseases classified elsewhere: Secondary | ICD-10-CM

## 2015-11-03 DIAGNOSIS — R112 Nausea with vomiting, unspecified: Secondary | ICD-10-CM | POA: Diagnosis not present

## 2015-11-03 DIAGNOSIS — Z79899 Other long term (current) drug therapy: Secondary | ICD-10-CM | POA: Diagnosis not present

## 2015-11-03 DIAGNOSIS — R05 Cough: Secondary | ICD-10-CM | POA: Diagnosis present

## 2015-11-03 DIAGNOSIS — J069 Acute upper respiratory infection, unspecified: Secondary | ICD-10-CM | POA: Diagnosis not present

## 2015-11-03 LAB — COMPREHENSIVE METABOLIC PANEL
ALBUMIN: 3.7 g/dL (ref 3.5–5.0)
ALK PHOS: 75 U/L (ref 38–126)
ALT: 20 U/L (ref 17–63)
AST: 21 U/L (ref 15–41)
Anion gap: 10 (ref 5–15)
BUN: 14 mg/dL (ref 6–20)
CALCIUM: 9.3 mg/dL (ref 8.9–10.3)
CHLORIDE: 100 mmol/L — AB (ref 101–111)
CO2: 30 mmol/L (ref 22–32)
CREATININE: 1.33 mg/dL — AB (ref 0.61–1.24)
GFR calc Af Amer: >60 mL/min (ref >60)
GFR calc non Af Amer: >60 mL/min (ref >60)
GLUCOSE: 112 mg/dL — AB (ref 65–99)
Potassium: 3.8 mmol/L (ref 3.5–5.1)
SODIUM: 140 mmol/L (ref 135–145)
Total Bilirubin: 0.6 mg/dL (ref 0.3–1.2)
Total Protein: 6.8 g/dL (ref 6.5–8.1)

## 2015-11-03 LAB — CBC WITH DIFFERENTIAL/PLATELET
BASOS ABS: 0 10*3/uL (ref 0.0–0.1)
BASOS PCT: 0 %
EOS ABS: 0.1 10*3/uL (ref 0.0–0.7)
Eosinophils Relative: 2 %
HCT: 40.1 % (ref 39.0–52.0)
HEMOGLOBIN: 13 g/dL (ref 13.0–17.0)
LYMPHS ABS: 2 10*3/uL (ref 0.7–4.0)
Lymphocytes Relative: 33 %
MCH: 29.2 pg (ref 26.0–34.0)
MCHC: 32.4 g/dL (ref 30.0–36.0)
MCV: 90.1 fL (ref 78.0–100.0)
Monocytes Absolute: 0.4 10*3/uL (ref 0.1–1.0)
Monocytes Relative: 7 %
NEUTROS PCT: 58 %
Neutro Abs: 3.6 10*3/uL (ref 1.7–7.7)
PLATELETS: 256 10*3/uL (ref 150–400)
RBC: 4.45 MIL/uL (ref 4.22–5.81)
RDW: 14.1 % (ref 11.5–15.5)
WBC: 6.2 10*3/uL (ref 4.0–10.5)

## 2015-11-03 LAB — I-STAT TROPONIN, ED: Troponin i, poc: 0.03 ng/mL (ref 0.00–0.08)

## 2015-11-03 LAB — LIPASE, BLOOD: Lipase: 19 U/L (ref 11–51)

## 2015-11-03 MED ORDER — ALBUTEROL SULFATE HFA 108 (90 BASE) MCG/ACT IN AERS
2.0000 | INHALATION_SPRAY | RESPIRATORY_TRACT | Status: DC | PRN
Start: 2015-11-03 — End: 2015-11-03
  Administered 2015-11-03: 2 via RESPIRATORY_TRACT
  Filled 2015-11-03: qty 6.7

## 2015-11-03 MED ORDER — GUAIFENESIN 100 MG/5ML PO LIQD: 100.0000 mg | ORAL | Status: DC | PRN

## 2015-11-03 MED ORDER — MORPHINE SULFATE (PF) 4 MG/ML IV SOLN
4.0000 mg | Freq: Once | INTRAVENOUS | Status: AC
  Administered 2015-11-03: 4 mg via INTRAVENOUS
  Filled 2015-11-03: qty 1

## 2015-11-03 MED ORDER — ONDANSETRON HCL 4 MG/2ML IJ SOLN
4.0000 mg | Freq: Once | INTRAMUSCULAR | Status: AC
  Administered 2015-11-03: 4 mg via INTRAVENOUS
  Filled 2015-11-03: qty 2

## 2015-11-03 MED ORDER — ONDANSETRON HCL 4 MG PO TABS: 4.0000 mg | ORAL_TABLET | Freq: Three times a day (TID) | ORAL | Status: DC | PRN

## 2015-11-03 MED ORDER — SODIUM CHLORIDE 0.9 % IV BOLUS (SEPSIS)
1000.0000 mL | Freq: Once | INTRAVENOUS | Status: AC
  Administered 2015-11-03: 1000 mL via INTRAVENOUS

## 2015-11-03 NOTE — ED Provider Notes (Signed)
CSN: [id number]     Arrival date & time 11/03/15  0805 History   First MD Initiated Contact with Patient 11/03/15 [id number]     No chief complaint on file.    (Consider location/radiation/quality/duration/timing/severity/associated sxs/prior Treatment) HPI   49 year old male with history of hypertension and history of seizure currently taking Tegretol presenting for evaluation of a cough. Patient report for the past 3 or 4 days he has had cold symptoms including nasal congestion, cough productive with yellow sputum, pleuritic chest pain, wheezing and shortness of breath. He also endorsed persistent nausea vomiting diarrhea for the same duration. Vomiting is less than 5 bouts per day, nonbloody nonbilious, diarrhea is persistent, nonbloody non-mucousy. He continues to go to work but states symptoms get progressively worse. He is a smoker. No history of asthma or COPD. No recent sick contact. No recent antibiotic use. He has been taking TheraFlu without adequate relief. Patient denies fever, headache, neck stiffness, abdominal pain, dysuria, or rash.  Past Medical History  Diagnosis Date  . Hypertension   . Seizures    Past Surgical History  Procedure Laterality Date  . Brain surgery  x 4  . Brain surgery     No family history on file. Social History  Substance Use Topics  . Smoking status: Current Every Day Smoker -- 1.00 packs/day  . Smokeless tobacco: Not on file  . Alcohol Use: No    Review of Systems  All other systems reviewed and are negative.     Allergies  Levetiracetam  Home Medications   Prior to Admission medications   Medication Sig Start Date End Date Taking? Authorizing Provider  amLODipine (NORVASC) 5 MG tablet Take 5 mg by mouth daily.     Historical Provider, MD  carbamazepine (TEGRETOL XR) 400 MG 12 hr tablet Take 800 mg by mouth 2 (two) times daily.    Historical Provider, MD  lisinopril-hydrochlorothiazide (PRINZIDE,ZESTORETIC) 20-25 MG per tablet Take 1  tablet by mouth daily.  11/19/13   Historical Provider, MD  meloxicam (MOBIC) 15 MG tablet Take 15 mg by mouth daily.    Historical Provider, MD  metoprolol succinate (TOPROL-XL) 100 MG 24 hr tablet Take 100 mg by mouth daily. Take with or immediately following a meal.    Historical Provider, MD  oxyCODONE-acetaminophen (PERCOCET) 10-325 MG per tablet Take 1 tablet by mouth every 4 (four) hours as needed for pain.    Historical Provider, MD   There were no vitals taken for this visit. Physical Exam  Constitutional: He is oriented to person, place, and time. He appears well-developed and well-nourished. No distress.  African-American male in no acute distress, playing on the phone while I was interviewing him.  HENT:  Head: Atraumatic.  Right Ear: External ear normal.  Left Ear: External ear normal.  Mouth/Throat: Oropharynx is clear and moist.  Eyes: Conjunctivae are normal.  Neck: Normal range of motion. Neck supple.  No nuchal rigidity  Cardiovascular: Normal rate and regular rhythm.  Exam reveals no gallop and no friction rub.   No murmur heard. Pulmonary/Chest: Effort normal. He has wheezes (Faint expiratory wheezes with mild rhonchi.).  Abdominal: Soft. He exhibits no distension. There is no tenderness.  Neurological: He is alert and oriented to person, place, and time.  Skin: No rash noted.  Psychiatric: He has a normal mood and affect.  Nursing note and vitals reviewed.   ED Course  Procedures (including critical care time) Labs Review Labs Reviewed  COMPREHENSIVE METABOLIC PANEL - Abnormal; Notable  for the following:    Chloride 100 (*)    Glucose, Bld 112 (*)    Creatinine, Ser 1.33 (*)    All other components within normal limits  CBC WITH DIFFERENTIAL/PLATELET  LIPASE, BLOOD  I-STAT TROPOININ, ED    Imaging Review Dg Chest 2 View  11/03/2015  CLINICAL DATA:  Cough.  Fever. EXAM: CHEST  2 VIEW COMPARISON:  None. FINDINGS: Mediastinum and hilar structures normal.  Borderline cardiomegaly . No pulmonary venous congestion. Mild bilateral pulmonary interstitial prominence. Mild pneumonitis cannot be excluded. No pleural effusion or pneumothorax. Diffuse osteopenia. Prior cervical spine fusion . Mild thoracic spine scoliosis. IMPRESSION: 1. Very mild interstitial prominence. Very mild changes of interstitial pneumonitis cannot be excluded. 2.  Borderline mild cardiomegaly.  No pulmonary venous congestion. Electronically Signed   By: Maisie Fus  Register   On: 11/03/2015 09:09   I have personally reviewed and evaluated these images and lab results as part of my medical decision-making.   EKG Interpretation None      MDM   Final diagnoses:  Viral upper respiratory tract infection with cough  Nausea vomiting and diarrhea    BP 161/89 mmHg  Pulse 51  Temp(Src) 97.8 F (36.6 C) (Oral)  Resp 12  SpO2 97%   8:39 AM Patient presents with viral syndrome including nasal congestion, productive cough, nausea vomiting diarrhea. He is well appearing, vital signs stable. He is hypertensive with blood pressure of 171/100. He does have history of hypertension and has not taken his morning medication. Workup initiated, symptomatic treatment given.  9:57 AM Chest x-ray shows some mild changes which may suggest interstitial pneumonitis but no signs of pneumonia. This is likely correspond with patient's wheezes and difficulty breathing. Albuterol inhaler has helped with his symptoms.  The remainder of his labs are reassuring.  Pt stable for discharge.  Pt requesting work note.    Fayrene Helper, PA-C 11/03/15 1007  Linwood Dibbles, MD 11/04/15 [phone]

## 2015-11-03 NOTE — ED Notes (Signed)
PA Tran at bedside. 

## 2015-11-03 NOTE — ED Notes (Signed)
Pt reports productive cough x3-4 days as well as feeling like his nose is "stopped up." pt reports n/v/d.

## 2015-11-03 NOTE — Discharge Instructions (Signed)
Viral Infections °A viral infection can be caused by different types of viruses. Most viral infections are not serious and resolve on their own. However, some infections may cause severe symptoms and may lead to further complications. °SYMPTOMS °Viruses can frequently cause: °· Minor sore throat. °· Aches and pains. °· Headaches. °· Runny nose. °· Different types of rashes. °· Watery eyes. °· Tiredness. °· Cough. °· Loss of appetite. °· Gastrointestinal infections, resulting in nausea, vomiting, and diarrhea. °These symptoms do not respond to antibiotics because the infection is not caused by bacteria. However, you might catch a bacterial infection following the viral infection. This is sometimes called a "superinfection." Symptoms of such a bacterial infection may include: °· Worsening sore throat with pus and difficulty swallowing. °· Swollen neck glands. °· Chills and a high or persistent fever. °· Severe headache. °· Tenderness over the sinuses. °· Persistent overall ill feeling (malaise), muscle aches, and tiredness (fatigue). °· Persistent cough. °· Yellow, green, or brown mucus production with coughing. °HOME CARE INSTRUCTIONS  °· Only take over-the-counter or prescription medicines for pain, discomfort, diarrhea, or fever as directed by your caregiver. °· Drink enough water and fluids to keep your urine clear or pale yellow. Sports drinks can provide valuable electrolytes, sugars, and hydration. °· Get plenty of rest and maintain proper nutrition. Soups and broths with crackers or rice are fine. °SEEK IMMEDIATE MEDICAL CARE IF:  °· You have severe headaches, shortness of breath, chest pain, neck pain, or an unusual rash. °· You have uncontrolled vomiting, diarrhea, or you are unable to keep down fluids. °· You or your child has an oral temperature above 102° F (38.9° C), not controlled by medicine. °· Your baby is older than 3 months with a rectal temperature of 102° F (38.9° C) or higher. °· Your baby is 3  months old or younger with a rectal temperature of 100.4° F (38° C) or higher. °MAKE SURE YOU:  °· Understand these instructions. °· Will watch your condition. °· Will get help right away if you are not doing well or get worse. °  °This information is not intended to replace advice given to you by your health care provider. Make sure you discuss any questions you have with your health care provider. °  °Document Released: 08/03/2005 Document Revised: 01/16/2012 Document Reviewed: 04/01/2015 °Elsevier Interactive Patient Education ©2016 [employer] ° °

## 2015-11-03 NOTE — ED Notes (Signed)
Patient sitting on stretcher, on his cell phone while provider in the room assessing him.

## 2015-11-03 NOTE — ED Notes (Signed)
Pt returns from radiology. 

## 2015-11-03 NOTE — ED Notes (Signed)
Patient transported to X-ray 

## 2015-12-14 ENCOUNTER — Other Ambulatory Visit (HOSPITAL_COMMUNITY): Payer: Self-pay | Admitting: Family Medicine

## 2015-12-14 DIAGNOSIS — M542 Cervicalgia: Secondary | ICD-10-CM

## 2015-12-14 DIAGNOSIS — M549 Dorsalgia, unspecified: Principal | ICD-10-CM

## 2015-12-14 DIAGNOSIS — G8929 Other chronic pain: Secondary | ICD-10-CM

## 2015-12-25 ENCOUNTER — Ambulatory Visit (HOSPITAL_COMMUNITY)
Admission: RE | Admit: 2015-12-25 | Discharge: 2015-12-25 | Disposition: A | Discharge status: Home / Self Care | Payer: Medicare Other | Source: Ambulatory Visit | Attending: Family Medicine | Admitting: Family Medicine

## 2015-12-25 DIAGNOSIS — R531 Weakness: Secondary | ICD-10-CM | POA: Diagnosis not present

## 2015-12-25 DIAGNOSIS — R200 Anesthesia of skin: Secondary | ICD-10-CM | POA: Insufficient documentation

## 2015-12-25 DIAGNOSIS — M79605 Pain in left leg: Secondary | ICD-10-CM | POA: Diagnosis not present

## 2015-12-25 DIAGNOSIS — M4692 Unspecified inflammatory spondylopathy, cervical region: Secondary | ICD-10-CM | POA: Diagnosis not present

## 2015-12-25 DIAGNOSIS — M549 Dorsalgia, unspecified: Principal | ICD-10-CM

## 2015-12-25 DIAGNOSIS — M5127 Other intervertebral disc displacement, lumbosacral region: Secondary | ICD-10-CM | POA: Insufficient documentation

## 2015-12-25 DIAGNOSIS — M5020 Other cervical disc displacement, unspecified cervical region: Secondary | ICD-10-CM | POA: Insufficient documentation

## 2015-12-25 DIAGNOSIS — G8929 Other chronic pain: Secondary | ICD-10-CM | POA: Insufficient documentation

## 2015-12-25 DIAGNOSIS — M542 Cervicalgia: Secondary | ICD-10-CM | POA: Insufficient documentation

## 2016-12-03 IMAGING — DX DG CHEST 2V
2 series · 2 of 2 positions shown · non-contrast
Comparison: None.

CLINICAL DATA: Cough.  Fever.

EXAM:
CHEST  2 VIEW

[chest pa]
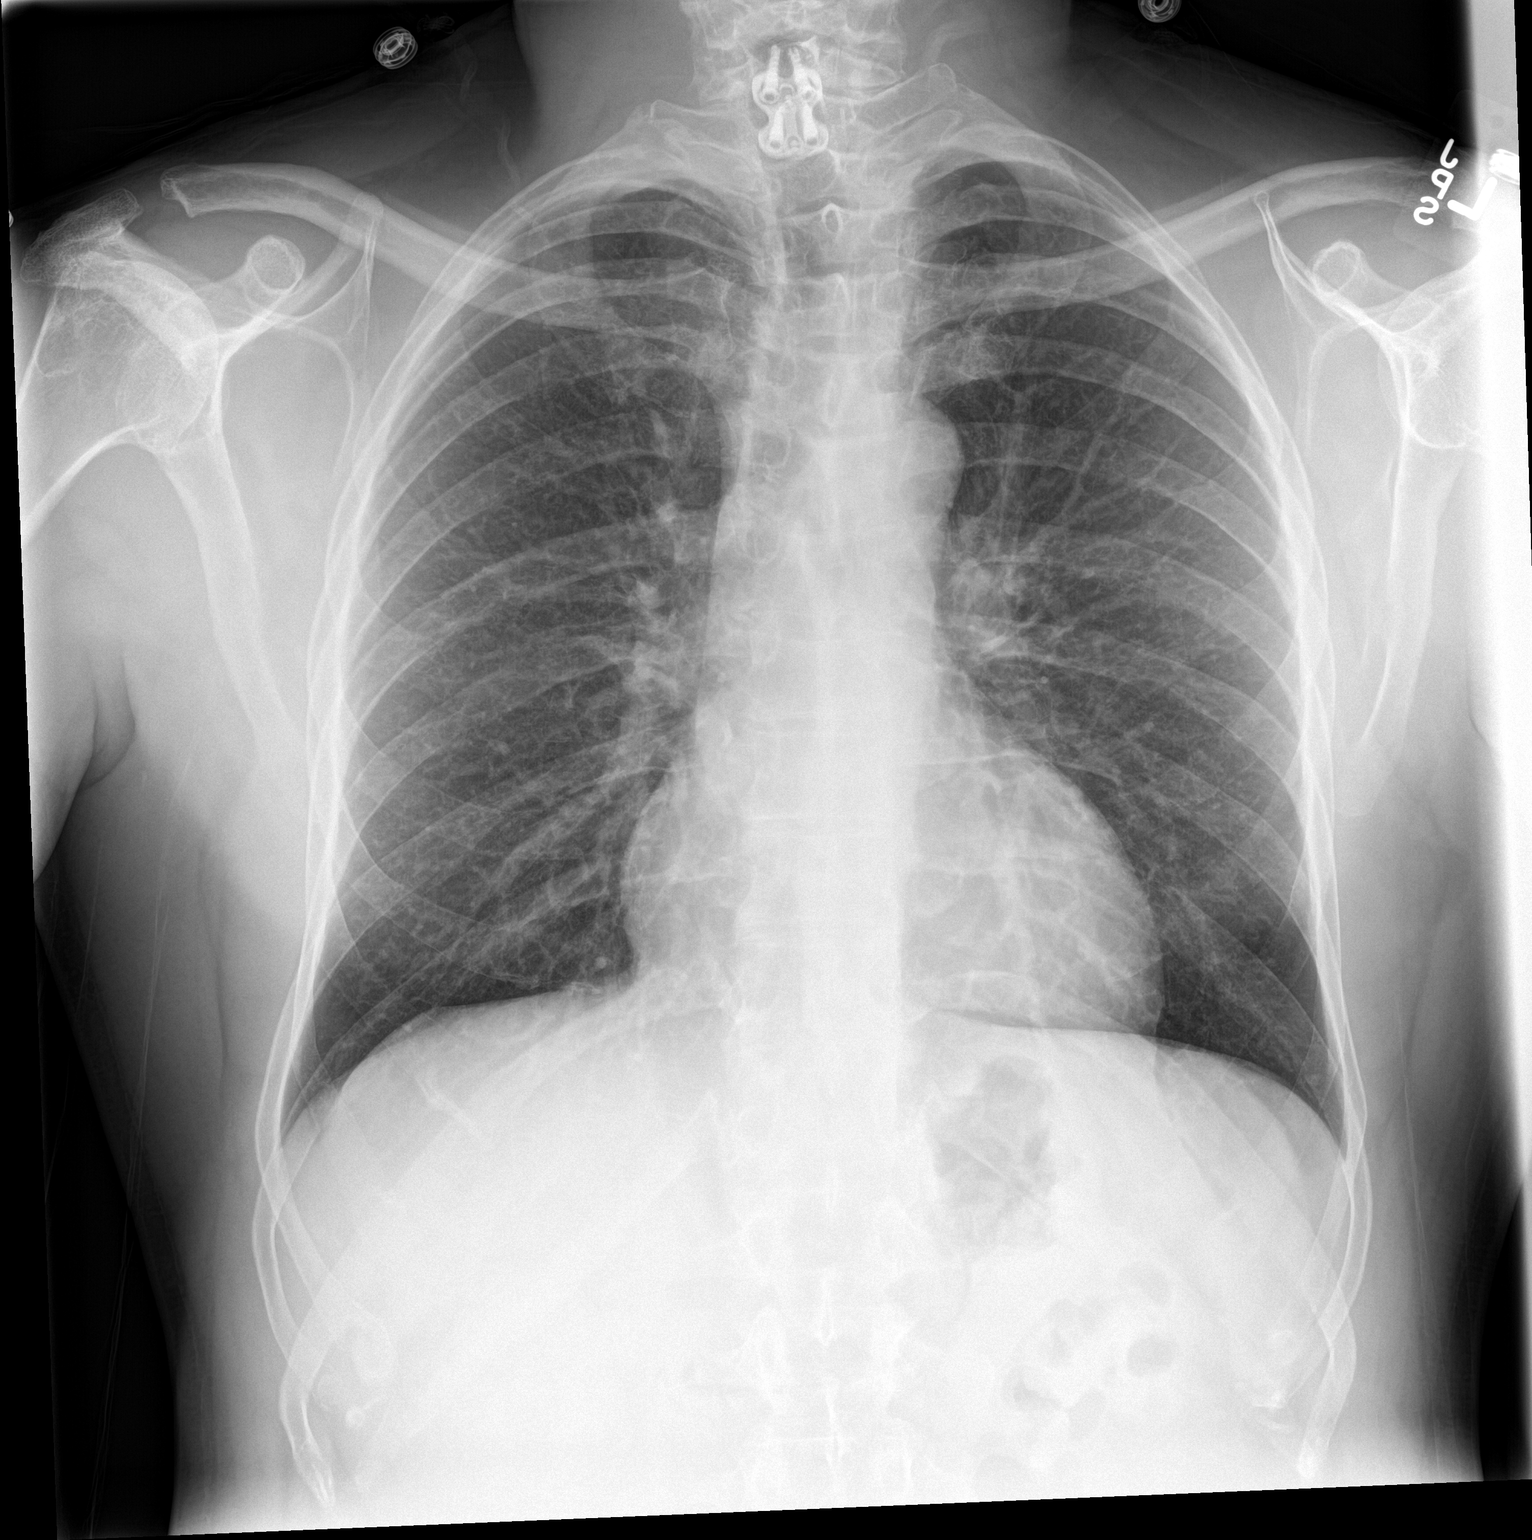

[chest lat]
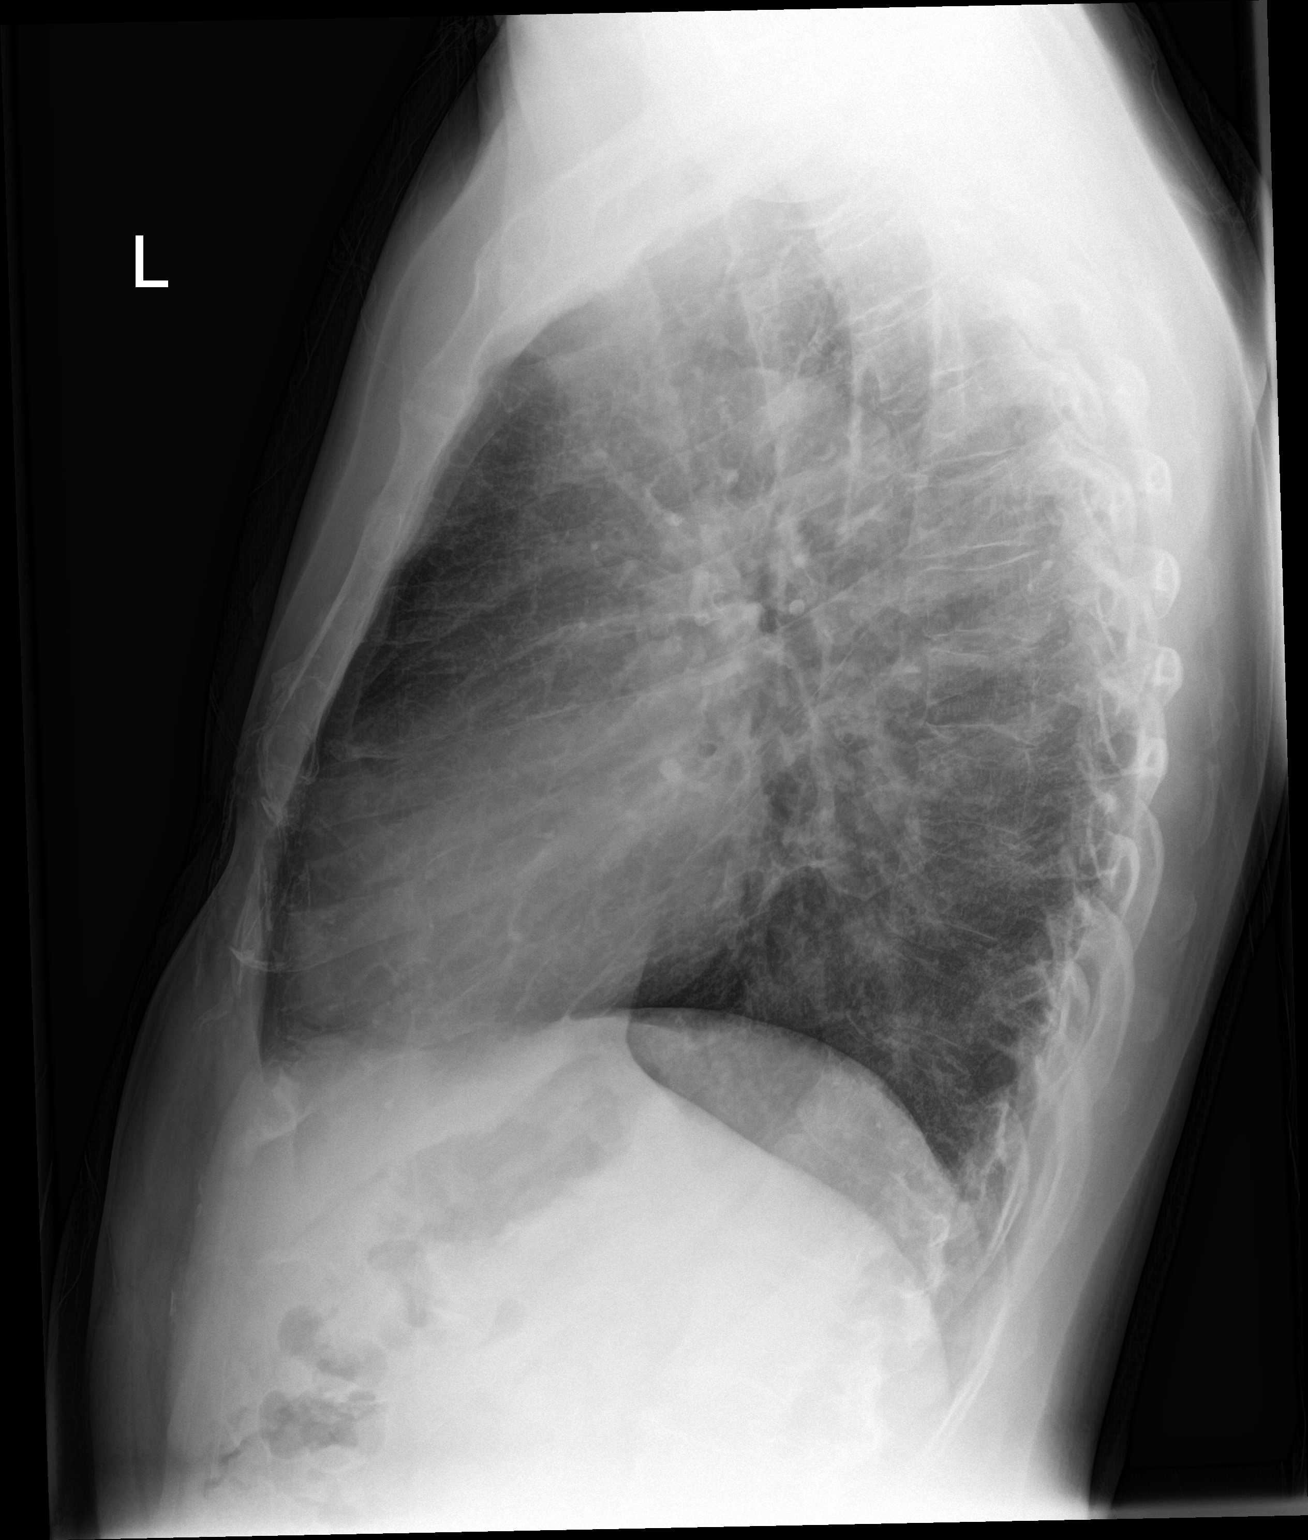

[2 of 2 positions shown; findings below may reference images not displayed]

FINDINGS: Mediastinum and hilar structures normal. Borderline cardiomegaly .
No pulmonary venous congestion. Mild bilateral pulmonary
interstitial prominence. Mild pneumonitis cannot be excluded. No
pleural effusion or pneumothorax. Diffuse osteopenia. Prior cervical
spine fusion . Mild thoracic spine scoliosis.
IMPRESSION: 1. Very mild interstitial prominence. Very mild changes of
interstitial pneumonitis cannot be excluded.

2.  Borderline mild cardiomegaly.  No pulmonary venous congestion.

## 2017-10-25 ENCOUNTER — Other Ambulatory Visit: Payer: Self-pay | Admitting: Pain Medicine

## 2017-10-25 DIAGNOSIS — M542 Cervicalgia: Secondary | ICD-10-CM

## 2018-06-23 ENCOUNTER — Other Ambulatory Visit: Payer: Self-pay

## 2018-06-23 ENCOUNTER — Encounter (HOSPITAL_COMMUNITY): Payer: Self-pay | Admitting: Emergency Medicine

## 2018-06-23 ENCOUNTER — Emergency Department (HOSPITAL_COMMUNITY)
Admission: EM | Admit: 2018-06-23 | Discharge: 2018-06-24 | Disposition: A | Discharge status: Home / Self Care | Payer: Medicare Other | Attending: Emergency Medicine | Admitting: Emergency Medicine

## 2018-06-23 DIAGNOSIS — K429 Umbilical hernia without obstruction or gangrene: Secondary | ICD-10-CM | POA: Diagnosis not present

## 2018-06-23 DIAGNOSIS — N50819 Testicular pain, unspecified: Secondary | ICD-10-CM | POA: Insufficient documentation

## 2018-06-23 DIAGNOSIS — Z79899 Other long term (current) drug therapy: Secondary | ICD-10-CM | POA: Diagnosis not present

## 2018-06-23 DIAGNOSIS — I10 Essential (primary) hypertension: Secondary | ICD-10-CM | POA: Diagnosis not present

## 2018-06-23 DIAGNOSIS — R1030 Lower abdominal pain, unspecified: Secondary | ICD-10-CM | POA: Diagnosis present

## 2018-06-23 DIAGNOSIS — F17210 Nicotine dependence, cigarettes, uncomplicated: Secondary | ICD-10-CM | POA: Diagnosis not present

## 2018-06-23 LAB — COMPREHENSIVE METABOLIC PANEL
ALT: 18 U/L (ref 0–44)
AST: 23 U/L (ref 15–41)
Albumin: 3.7 g/dL (ref 3.5–5.0)
Alkaline Phosphatase: 72 U/L (ref 38–126)
Anion gap: 7 (ref 5–15)
BUN: 30 mg/dL — ABNORMAL HIGH (ref 6–20)
CHLORIDE: 102 mmol/L (ref 98–111)
CO2: 28 mmol/L (ref 22–32)
CREATININE: 0.87 mg/dL (ref 0.61–1.24)
Calcium: 9 mg/dL (ref 8.9–10.3)
GFR calc Af Amer: >60 mL/min (ref >60)
GFR calc non Af Amer: >60 mL/min (ref >60)
Glucose, Bld: 132 mg/dL — ABNORMAL HIGH (ref 70–99)
POTASSIUM: 4.4 mmol/L (ref 3.5–5.1)
Sodium: 137 mmol/L (ref 135–145)
Total Bilirubin: 0.7 mg/dL (ref 0.3–1.2)
Total Protein: 7 g/dL (ref 6.5–8.1)

## 2018-06-23 LAB — URINALYSIS, ROUTINE W REFLEX MICROSCOPIC
Bacteria, UA: NONE SEEN
Bilirubin Urine: NEGATIVE
Glucose, UA: NEGATIVE mg/dL
Ketones, ur: NEGATIVE mg/dL
Leukocytes, UA: NEGATIVE
Nitrite: NEGATIVE
Protein, ur: NEGATIVE mg/dL
Specific Gravity, Urine: 1.025 (ref 1.005–1.030)
pH: 5 (ref 5.0–8.0)

## 2018-06-23 LAB — CBC
HCT: 40.5 % (ref 39.0–52.0)
Hemoglobin: 13.1 g/dL (ref 13.0–17.0)
MCH: 30.2 pg (ref 26.0–34.0)
MCHC: 32.3 g/dL (ref 30.0–36.0)
MCV: 93.3 fL (ref 78.0–100.0)
Platelets: 339 K/uL (ref 150–400)
RBC: 4.34 MIL/uL (ref 4.22–5.81)
RDW: 14.3 % (ref 11.5–15.5)
WBC: 7.4 K/uL (ref 4.0–10.5)

## 2018-06-23 LAB — LIPASE, BLOOD: Lipase: 37 U/L (ref 11–51)

## 2018-06-23 NOTE — ED Triage Notes (Signed)
Pt c/o intermittent lower abdominal pain x 1 year, worsening "lately". Denies urinary symptoms, no nausea/vomiting/diarrhea.

## 2018-06-24 ENCOUNTER — Emergency Department (HOSPITAL_COMMUNITY): Payer: Medicare Other

## 2018-06-24 MED ORDER — DOXYCYCLINE HYCLATE 100 MG PO CAPS: 100.0000 mg | ORAL_CAPSULE | Freq: Two times a day (BID) | ORAL | 0 refills | Status: DC

## 2018-06-24 MED ORDER — LIDOCAINE-EPINEPHRINE (PF) 2 %-1:200000 IJ SOLN: 20.0000 mL | Freq: Once | INTRAMUSCULAR | Status: DC

## 2018-06-24 MED ORDER — HYDROCODONE-ACETAMINOPHEN 5-325 MG PO TABS
2.0000 | ORAL_TABLET | Freq: Once | ORAL | Status: AC
Start: 2018-06-24 — End: 2018-06-24
  Administered 2018-06-24: 2 via ORAL
  Filled 2018-06-24: qty 2

## 2018-06-24 NOTE — ED Notes (Signed)
Given graham crackers, peanut butter and ice for drink

## 2018-06-24 NOTE — Discharge Instructions (Addendum)
1. Medications: Doxycycline, usual home medications 2. Treatment: rest, drink plenty of fluids,  3. Follow Up: Please followup with Urology and general surgery for discussion of your diagnoses and further evaluation after today's visit; if you do not have a primary care doctor use the resource guide provided to find one; Please return to the ER for fevers, worsening pain, sweats, burning with urination or other concerns

## 2018-06-24 NOTE — ED Provider Notes (Signed)
[facility]GENCY [facility] Provider Note   CSN: [id number] Arrival date & time: 06/23/18  2218     History   Chief Complaint Chief Complaint  Patient presents with  . Abdominal Pain    HPI Shawn Potts is a 52 y.o. male with a hx of hypertension, seizures presents to the Emergency Department complaining of intermittent " groin and lower abdominal" pain onset approximately 1 year ago.  Patient reports that he has episodes approximately 1 time per month lasting approximately 1 week with spontaneous resolution.  He reports that when the symptoms happen movement, especially yard work makes his pain significantly worse but his pain is significantly better when he lays down.  He reports no fever, chills, headache, neck pain, chest pain, shortness of breath, nausea, vomiting, diarrhea, weakness, dizziness, syncope, dysuria, penile discharge, penile lesions.  Patient reports that this episode of pain started 6 days ago.  No treatments prior to arrival.  Patient reports normal bowel movements without melena or hematochezia.  No previous abdominal surgeries.  No known hernias.   The history is provided by the patient and medical records. No language interpreter was used.  Abdominal Pain   Pertinent negatives include fever, diarrhea, nausea, vomiting, constipation, dysuria, frequency, hematuria and headaches.    Past Medical History:  Diagnosis Date  . Hypertension   . Seizures [facility](HCC)     Patient Active Problem List   Diagnosis Date Noted  . Chest pain 02/06/2012  . HTN (hypertension) 02/06/2012  . Seizure disorder (HCC) 02/06/2012  . Tobacco abuse 02/06/2012    Past Surgical History:  Procedure Laterality Date  . BRAIN SURGERY  x 4  . BRAIN SURGERY          Home Medications    Prior to Admission medications   Medication Sig Start Date End Date Taking? Authorizing Provider  amLODipine (NORVASC) 5 MG tablet Take 5 mg by mouth daily.     [provider]  carbamazepine (TEGRETOL XR) 400 MG 12 hr tablet Take 800 mg by mouth 2 (two) times daily.    [provider]  doxycycline (VIBRAMYCIN) 100 MG capsule Take 1 capsule (100 mg total) by mouth 2 (two) times daily. 06/24/18   Juliona Vales, Dahlia ClientHannah, PA-C  guaiFENesin (ROBITUSSIN) 100 MG/5ML liquid Take 5-10 mLs (100-200 mg total) by mouth every 4 (four) hours as needed for cough. 11/03/15   Fayrene Helperran, Bow[place], PA-C  lisinopril-hydrochlorothiazide (PRINZIDE,ZESTORETIC) 20-25 MG per tablet Take 1 tablet by mouth daily.  11/19/13   [provider]  meloxicam (MOBIC) 15 MG tablet Take 15 mg by mouth daily.    [provider]  metoprolol succinate (TOPROL-XL) 100 MG 24 hr tablet Take 100 mg by mouth daily. Take with or immediately following a meal.    [provider]  ondansetron (ZOFRAN) 4 MG tablet Take 1 tablet (4 mg total) by mouth every 8 (eight) hours as needed for nausea or vomiting. 11/03/15   Fayrene Helperran, [place], PA-C  oxyCODONE-acetaminophen (PERCOCET) 10-325 MG per tablet Take 1 tablet by mouth every 4 (four) hours as needed for pain.    [provider]    Family History No family history on file.  Social History Social History   Tobacco Use  . Smoking status: Current Every Day Smoker    Packs/day: 1.00  . Smokeless tobacco: Never Used  Substance Use Topics  . Alcohol use: No  . Drug use: Yes    Types: Marijuana     Allergies  Levetiracetam   Review of Systems Review of Systems  Constitutional: Negative for appetite change, diaphoresis, fatigue, fever and unexpected weight change.  HENT: Negative for mouth sores.   Eyes: Negative for visual disturbance.  Respiratory: Negative for cough, chest tightness, shortness of breath and wheezing.   Cardiovascular: Negative for chest pain.  Gastrointestinal: Positive for abdominal pain. Negative for constipation, diarrhea, nausea and vomiting.  Endocrine: Negative for polydipsia, polyphagia and  polyuria.  Genitourinary: Negative for dysuria, frequency, hematuria and urgency.       "Groin pain"  Musculoskeletal: Negative for back pain and neck stiffness.  Skin: Negative for rash.  Allergic/Immunologic: Negative for immunocompromised state.  Neurological: Negative for syncope, light-headedness and headaches.  Hematological: Does not bruise/bleed easily.  Psychiatric/Behavioral: Negative for sleep disturbance. The patient is not nervous/anxious.      Physical Exam Updated Vital Signs BP (!) 190/99 (BP Location: Right Arm)   Pulse (!) 58   Temp 97.9 F (36.6 C) (Oral)   Resp 16   Ht 5\' 8"  (1.727 m)   Wt 73.5 kg   SpO2 99%   BMI 24.63 kg/m   Physical Exam  Constitutional: He appears well-developed and well-nourished. No distress.  Awake, alert, nontoxic appearance  HENT:  Head: Normocephalic and atraumatic.  Mouth/Throat: Oropharynx is clear and moist. No oropharyngeal exudate.  Eyes: Conjunctivae are normal. No scleral icterus.  Neck: Normal range of motion. Neck supple.  Cardiovascular: Normal rate, regular rhythm and intact distal pulses.  Pulmonary/Chest: Effort normal and breath sounds normal. No respiratory distress. He has no wheezes.  Equal chest expansion  Abdominal: Soft. Bowel sounds are normal. He exhibits no mass. There is no tenderness. There is no rebound and no guarding. Hernia confirmed negative in the right inguinal area and confirmed negative in the left inguinal area.  Small, soft, completely reducible, umbilical hernia  Genitourinary: Penis normal. Right testis shows tenderness. Right testis shows no mass and no swelling. Right testis is descended. Left testis shows tenderness. Left testis shows no mass and no swelling. Left testis is descended. Circumcised. No phimosis, paraphimosis, hypospadias, penile erythema or penile tenderness. No discharge found.  Genitourinary Comments: Chaperone present Exquisite tenderness to palpation to bilateral testes.   Exam limited due to this. No erythema or induration to the scrotum.  No evidence of scrotal abscess or cellulitis. Perineum is nontender and without induration.  Musculoskeletal: Normal range of motion. He exhibits no edema.  Lymphadenopathy: No inguinal adenopathy noted on the right or left side.  Neurological: He is alert.  Speech is clear and goal oriented Moves extremities without ataxia  Skin: Skin is warm and dry. He is not diaphoretic.  Psychiatric: He has a normal mood and affect.  Nursing note and vitals reviewed.    ED Treatments / Results  Labs (all labs ordered are listed, but only abnormal results are displayed) Labs Reviewed  COMPREHENSIVE METABOLIC PANEL - Abnormal; Notable for the following components:      Result Value   Glucose, Bld 132 (*)    BUN 30 (*)    All other components within normal limits  URINALYSIS, ROUTINE W REFLEX MICROSCOPIC - Abnormal; Notable for the following components:   Hgb urine dipstick SMALL (*)    All other components within normal limits  LIPASE, BLOOD  CBC  GC/CHLAMYDIA PROBE AMP ([place]) NOT AT [facility]     Radiology US Scrotum W/doppler  Result Date: 06/24/2018 CLINICAL DATA:  Initial evaluation for acute testicular pain. EXAM: SCROTAL ULTRASOUND DOPPLER  ULTRASOUND OF THE TESTICLES TECHNIQUE: Complete ultrasound examination of the testicles, epididymis, and other scrotal structures was performed. Color and spectral Doppler ultrasound were also utilized to evaluate blood flow to the testicles. COMPARISON:  None. FINDINGS: Right testicle Measurements: 3.9 x 1.7 x 2.7 cm. No mass or microlithiasis visualized. Left testicle Measurements: 3.7 x 1.7 x 2.7 cm. No mass or microlithiasis visualized. Right epididymis:  Normal in size and appearance. Left epididymis:  Normal in size and appearance. Hydrocele:  None visualized. Varicocele:  None visualized. Pulsed Doppler interrogation of both testes demonstrates normal low resistance arterial  and venous waveforms bilaterally. IMPRESSION: Normal scrotal ultrasound.  No acute abnormality identified. Electronically Signed   By: Rise MuBenjamin  McClintock M.D.   On: 06/24/2018 02:55    Procedures Procedures (including critical care time)  Medications Ordered in ED Medications  HYDROcodone-acetaminophen (NORCO/VICODIN) 5-325 MG per tablet 2 tablet (2 tablets Oral Given 06/24/18 0212)     Initial Impression / Assessment and Plan / ED Course  I have reviewed the triage vital signs and the nursing notes.  Pertinent labs & imaging results that were available during my care of the patient were reviewed by me and considered in my medical decision making (see chart for details).  Clinical Course as of Jun 24 438  Sun Jun 24, 2018  0222 Elevated BUN but normal creatinine  BUN(!): 30 [HM]  0222 Small amount of hemoglobin in patient's urine but no evidence of urinary tract infection.  Hgb urine dipstick(!): SMALL [HM]  0222 Patient noted to be hypertensive on arrival to the emergency department.  BP(!): 190/99 [HM]  0439 Blood pressure improved significantly here in the emergency department.  BP(!): 165/89 [HM]    Clinical Course User Index [HM] Rilda Bulls, Dahlia ClientHannah, PA-C    Patient presents with complaints of groin pain.  On exam patient's testicles are very tender.  Scrotal ultrasound without abnormality.  No evidence of Fournier's gangrene.  Clinically, more consistent with epididymitis.  No evidence of cellulitis or abscess.  Will treat with doxycycline.  Patient will be referred to urology for this.  Discussed reasons to return immediately to the emergency department.  Since umbilical hernia is soft and completely reducible.  I do not suspect this is the cause of patient's pain.  He will be referred to general surgery for this.  Patient noted to be hypertensive in the emergency department.  No signs of hypertensive urgency.  Discussed with patient the need for close follow-up and  management by their primary care physician.    Final Clinical Impressions(s) / ED Diagnoses   Final diagnoses:  Testicular pain  Umbilical hernia without obstruction and without gangrene  Essential hypertension    ED Discharge Orders         Ordered    doxycycline (VIBRAMYCIN) 100 MG capsule  2 times daily     06/24/18 0430           Renita Brocks, Boyd KerbsHannah, PA-C 06/24/18 0439    Melene PlanFloyd, Dan, DO 06/24/18 [id number]

## 2018-06-24 NOTE — ED Notes (Signed)
Hannah, PA at the bedside.  

## 2018-06-24 NOTE — ED Notes (Signed)
Patient transported to Ultrasound 

## 2018-06-24 NOTE — ED Notes (Signed)
Pt returned from US

## 2018-06-25 LAB — GC/CHLAMYDIA PROBE AMP (~~LOC~~) NOT AT ARMC
CHLAMYDIA, DNA PROBE: NEGATIVE
NEISSERIA GONORRHEA: NEGATIVE

## 2018-08-27 ENCOUNTER — Emergency Department: Payer: Medicare Other

## 2018-08-27 ENCOUNTER — Other Ambulatory Visit: Payer: Self-pay

## 2018-08-27 ENCOUNTER — Emergency Department
Admission: EM | Admit: 2018-08-27 | Discharge: 2018-08-27 | Disposition: A | Discharge status: Home / Self Care | Payer: Medicare Other | Attending: Emergency Medicine | Admitting: Emergency Medicine

## 2018-08-27 DIAGNOSIS — R51 Headache: Secondary | ICD-10-CM | POA: Diagnosis not present

## 2018-08-27 DIAGNOSIS — R079 Chest pain, unspecified: Secondary | ICD-10-CM | POA: Diagnosis not present

## 2018-08-27 DIAGNOSIS — R519 Headache, unspecified: Secondary | ICD-10-CM

## 2018-08-27 DIAGNOSIS — Z79899 Other long term (current) drug therapy: Secondary | ICD-10-CM | POA: Diagnosis not present

## 2018-08-27 DIAGNOSIS — I10 Essential (primary) hypertension: Secondary | ICD-10-CM | POA: Insufficient documentation

## 2018-08-27 DIAGNOSIS — F17210 Nicotine dependence, cigarettes, uncomplicated: Secondary | ICD-10-CM | POA: Insufficient documentation

## 2018-08-27 LAB — CBC
HCT: 42.1 % (ref 39.0–52.0)
Hemoglobin: 14.3 g/dL (ref 13.0–17.0)
MCH: 30.8 pg (ref 26.0–34.0)
MCHC: 34 g/dL (ref 30.0–36.0)
MCV: 90.5 fL (ref 80.0–100.0)
Platelets: 369 10*3/uL (ref 150–400)
RBC: 4.65 MIL/uL (ref 4.22–5.81)
RDW: 13.9 % (ref 11.5–15.5)
WBC: 9.5 10*3/uL (ref 4.0–10.5)
nRBC: 0.2 % (ref 0.0–0.2)

## 2018-08-27 LAB — COMPREHENSIVE METABOLIC PANEL
ALBUMIN: 4.2 g/dL (ref 3.5–5.0)
ALK PHOS: 71 U/L (ref 38–126)
ALT: 22 U/L (ref 0–44)
AST: 21 U/L (ref 15–41)
Anion gap: 8 (ref 5–15)
BUN: 31 mg/dL — ABNORMAL HIGH (ref 6–20)
CALCIUM: 9.4 mg/dL (ref 8.9–10.3)
CHLORIDE: 100 mmol/L (ref 98–111)
CO2: 31 mmol/L (ref 22–32)
CREATININE: 1.23 mg/dL (ref 0.61–1.24)
GFR calc Af Amer: >60 mL/min (ref >60)
GFR calc non Af Amer: >60 mL/min (ref >60)
GLUCOSE: 114 mg/dL — AB (ref 70–99)
Potassium: 4.6 mmol/L (ref 3.5–5.1)
SODIUM: 139 mmol/L (ref 135–145)
Total Bilirubin: 0.6 mg/dL (ref 0.3–1.2)
Total Protein: 8.1 g/dL (ref 6.5–8.1)

## 2018-08-27 LAB — TROPONIN I: Troponin I: <0.03 ng/mL (ref <0.03)

## 2018-08-27 NOTE — ED Triage Notes (Signed)
Pt arrived via ems for report of headache for the last 8-9 months that got increasingly worse over the last 2 days noted with vomiting and "passing out" - pt also c/o right sided chest pain that is worse with inspiration and movement

## 2018-08-27 NOTE — ED Provider Notes (Signed)
[facility] Emergency Department Provider Note  Time seen: 8:45 AM  I have reviewed the triage vital signs and the nursing notes.   HISTORY  Chief Complaint Headache    HPI Shawn Potts is a 52 y.o. male with a past medical history of hypertension, seizures, chest pain, brain surgery, presents to the emergency department for headache and chest pain.  According to the patient for the past 9 months he has been experiencing pain in the right side of his head.  States he has been more severe recently.  States yesterday he developed some right-sided chest pain as well and had one episode of vomiting.  States his sister told him that he might of passed out last night.  Patient does state a history of seizure disorders does not currently take any seizure medication but has not had a seizure in greater than 1 year.  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  States that is where he continues to have headache.  Currently describes a headache as moderate aching pain in the right side of his head as well as mild right-sided chest pain.  States he has a cough but always has a cough as he is a daily smoker.  Denies any fever.   Past Medical History:  Diagnosis Date  . Hypertension   . Seizures [facility])     Patient Active Problem List   Diagnosis Date Noted  . Chest pain 02/06/2012  . HTN (hypertension) 02/06/2012  . Seizure disorder (HCC) 02/06/2012  . Tobacco abuse 02/06/2012    Past Surgical History:  Procedure Laterality Date  . BRAIN SURGERY  x 4  . BRAIN SURGERY      Prior to Admission medications   Medication Sig Start Date End Date Taking? Authorizing Provider  amLODipine (NORVASC) 5 MG tablet Take 5 mg by mouth daily.     [provider]  carbamazepine (TEGRETOL XR) 400 MG 12 hr tablet Take 800 mg by mouth 2 (two) times daily.    [provider]  doxycycline (VIBRAMYCIN)  100 MG capsule Take 1 capsule (100 mg total) by mouth 2 (two) times daily. 06/24/18   Muthersbaugh, Dahlia Client, PA-C  guaiFENesin (ROBITUSSIN) 100 MG/5ML liquid Take 5-10 mLs (100-200 mg total) by mouth every 4 (four) hours as needed for cough. 11/03/15   Fayrene Helper, PA-C  lisinopril-hydrochlorothiazide (PRINZIDE,ZESTORETIC) 20-25 MG per tablet Take 1 tablet by mouth daily.  11/19/13   [provider]  meloxicam (MOBIC) 15 MG tablet Take 15 mg by mouth daily.    [provider]  metoprolol succinate (TOPROL-XL) 100 MG 24 hr tablet Take 100 mg by mouth daily. Take with or immediately following a meal.    [provider]  ondansetron (ZOFRAN) 4 MG tablet Take 1 tablet (4 mg total) by mouth every 8 (eight) hours as needed for nausea or vomiting. 11/03/15   Fayrene Helper, PA-C  oxyCODONE-acetaminophen (PERCOCET) 10-325 MG per tablet Take 1 tablet by mouth every 4 (four) hours as needed for pain.    [provider]    Allergies  Allergen Reactions  . Levetiracetam     Pt states, "When I take Keppra it makes me feel like I am having a heart attack."     No family history on file.  Social History Social History   Tobacco Use  . Smoking status: Current Every Day Smoker    Packs/day:  1.00  . Smokeless tobacco: Never Used  Substance Use Topics  . Alcohol use: No  . Drug use: Yes    Types: Marijuana    Review of Systems Constitutional: Negative for fever. Cardiovascular: Right-sided chest pain since yesterday, mild Respiratory: Negative for shortness of breath.  Positive for cough Gastrointestinal: Negative for abdominal pain and one episode of vomiting last night Musculoskeletal: Negative for musculoskeletal complaints Skin: Negative for skin complaints  Neurological: Moderate right-sided headache All other ROS negative  ____________________________________________   PHYSICAL EXAM:  Constitutional: Alert and oriented. Well appearing and in no  distress. Eyes: Normal exam ENT   Head: Normocephalic and atraumatic   Mouth/Throat: Mucous membranes are moist. Cardiovascular: Normal rate, regular rhythm. No murmur Respiratory: Normal respiratory effort without tachypnea nor retractions.  Slight expiratory wheeze bilaterally. Gastrointestinal: Soft and nontender. No distention. Musculoskeletal: Nontender with normal range of motion in all extremities.  Neurologic:  Normal speech and language. No gross focal neurologic deficits  Skin:  Skin is warm, dry and intact.  Psychiatric: Mood and affect are normal.  ____________________________________________    EKG EKG reviewed and interpreted by myself shows a normal sinus rhythm at 55 bpm with a narrow QRS, normal axis, normal intervals, no concerning ST changes.  ____________________________________________    RADIOLOGY  CT shows encephalomalacia no acute infarct.  ____________________________________________   INITIAL IMPRESSION / ASSESSMENT AND PLAN / ED COURSE  Pertinent labs & imaging results that were available during my care of the patient were reviewed by me and considered in my medical decision making (see chart for details).  Patient presents to the emergency department for right-sided headache ongoing for 9 months as well as mild right chest pain since last night.  Differential for the headache would include tension headache, migraine headache, cluster headache, recurrence of tumor, increased cranial pressure, pain due to prior incisions/scar tissue.  Will obtain CT imaging and basic labs.  As far as the patient's right-sided chest discomfort he does have a mild wheeze bilaterally we will obtain a chest x-ray to help rule out pneumonia, will obtain labs including cardiac enzymes as well as an EKG.  Patient agreeable to plan of care.  His work-up is largely nonrevealing, largely normal labs, EKG is reassuring, CT scan of the head shows encephalomalacia but no acute  abnormality.  Labs including cardiac enzymes are normal.  As the symptoms have been ongoing for approximate 9 months I believe the patient is safe for discharge home with neurology follow-up.  Patient states he has a neurologist in [place] that he will follow-up with.  ____________________________________________   FINAL CLINICAL IMPRESSION(S) / ED DIAGNOSES  Headache Chest pain    Minna Antis, MD 08/27/18 1142

## 2018-08-27 NOTE — ED Notes (Signed)
Pt was given asa 324mg  by EMS

## 2018-09-25 ENCOUNTER — Other Ambulatory Visit: Payer: Self-pay | Admitting: Neurology

## 2018-09-25 DIAGNOSIS — R51 Headache: Principal | ICD-10-CM

## 2018-09-25 DIAGNOSIS — G8929 Other chronic pain: Secondary | ICD-10-CM

## 2018-10-12 ENCOUNTER — Ambulatory Visit: Payer: Medicare Other

## 2018-10-23 ENCOUNTER — Emergency Department (HOSPITAL_COMMUNITY)
Admission: EM | Admit: 2018-10-23 | Discharge: 2018-10-23 | Disposition: A | Discharge status: Home / Self Care | Payer: Medicare Other | Attending: Emergency Medicine | Admitting: Emergency Medicine

## 2018-10-23 ENCOUNTER — Emergency Department (HOSPITAL_COMMUNITY): Payer: Medicare Other

## 2018-10-23 ENCOUNTER — Encounter (HOSPITAL_COMMUNITY): Payer: Self-pay

## 2018-10-23 ENCOUNTER — Other Ambulatory Visit: Payer: Self-pay

## 2018-10-23 DIAGNOSIS — R4182 Altered mental status, unspecified: Secondary | ICD-10-CM | POA: Insufficient documentation

## 2018-10-23 DIAGNOSIS — I10 Essential (primary) hypertension: Secondary | ICD-10-CM | POA: Diagnosis not present

## 2018-10-23 DIAGNOSIS — Z79899 Other long term (current) drug therapy: Secondary | ICD-10-CM | POA: Diagnosis not present

## 2018-10-23 DIAGNOSIS — R45851 Suicidal ideations: Secondary | ICD-10-CM | POA: Insufficient documentation

## 2018-10-23 DIAGNOSIS — R451 Restlessness and agitation: Secondary | ICD-10-CM | POA: Insufficient documentation

## 2018-10-23 DIAGNOSIS — F333 Major depressive disorder, recurrent, severe with psychotic symptoms: Secondary | ICD-10-CM | POA: Diagnosis not present

## 2018-10-23 DIAGNOSIS — F99 Mental disorder, not otherwise specified: Secondary | ICD-10-CM | POA: Diagnosis present

## 2018-10-23 DIAGNOSIS — F17200 Nicotine dependence, unspecified, uncomplicated: Secondary | ICD-10-CM | POA: Insufficient documentation

## 2018-10-23 LAB — COMPREHENSIVE METABOLIC PANEL
ALBUMIN: 3.7 g/dL (ref 3.5–5.0)
ALK PHOS: 66 U/L (ref 38–126)
ALT: 21 U/L (ref 0–44)
AST: 19 U/L (ref 15–41)
Anion gap: 11 (ref 5–15)
BILIRUBIN TOTAL: 0.5 mg/dL (ref 0.3–1.2)
BUN: 14 mg/dL (ref 6–20)
CALCIUM: 9.1 mg/dL (ref 8.9–10.3)
CO2: 25 mmol/L (ref 22–32)
Chloride: 103 mmol/L (ref 98–111)
Creatinine, Ser: 1.43 mg/dL — ABNORMAL HIGH (ref 0.61–1.24)
GFR calc Af Amer: >60 mL/min (ref >60)
GFR calc non Af Amer: 56 mL/min — ABNORMAL LOW (ref >60)
GLUCOSE: 108 mg/dL — AB (ref 70–99)
Potassium: 3.7 mmol/L (ref 3.5–5.1)
SODIUM: 139 mmol/L (ref 135–145)
TOTAL PROTEIN: 6.8 g/dL (ref 6.5–8.1)

## 2018-10-23 LAB — CBC
HEMATOCRIT: 39.6 % (ref 39.0–52.0)
HEMOGLOBIN: 12.8 g/dL — AB (ref 13.0–17.0)
MCH: 29.7 pg (ref 26.0–34.0)
MCHC: 32.3 g/dL (ref 30.0–36.0)
MCV: 91.9 fL (ref 80.0–100.0)
Platelets: 360 10*3/uL (ref 150–400)
RBC: 4.31 MIL/uL (ref 4.22–5.81)
RDW: 13.1 % (ref 11.5–15.5)
WBC: 8.2 10*3/uL (ref 4.0–10.5)
nRBC: 0 % (ref 0.0–0.2)

## 2018-10-23 LAB — ACETAMINOPHEN LEVEL: Acetaminophen (Tylenol), Serum: <10 ug/mL — ABNORMAL LOW (ref 10–30)

## 2018-10-23 LAB — ETHANOL: Alcohol, Ethyl (B): <10 mg/dL (ref <10)

## 2018-10-23 LAB — SALICYLATE LEVEL: Salicylate Lvl: <7 mg/dL (ref 2.8–30.0)

## 2018-10-23 MED ORDER — METOPROLOL SUCCINATE ER 25 MG PO TB24: 100.0000 mg | ORAL_TABLET | Freq: Every day | ORAL | Status: DC

## 2018-10-23 MED ORDER — LISINOPRIL-HYDROCHLOROTHIAZIDE 20-25 MG PO TABS: 1.0000 | ORAL_TABLET | Freq: Every day | ORAL | Status: DC

## 2018-10-23 MED ORDER — AMLODIPINE BESYLATE 5 MG PO TABS: 5.0000 mg | ORAL_TABLET | Freq: Every day | ORAL | Status: DC

## 2018-10-23 MED ORDER — LISINOPRIL 20 MG PO TABS: 20.0000 mg | ORAL_TABLET | Freq: Every day | ORAL | Status: DC

## 2018-10-23 MED ORDER — CARBAMAZEPINE ER 200 MG PO TB12: 800.0000 mg | ORAL_TABLET | Freq: Two times a day (BID) | ORAL | Status: DC

## 2018-10-23 MED ORDER — HYDROCHLOROTHIAZIDE 25 MG PO TABS: 25.0000 mg | ORAL_TABLET | Freq: Every day | ORAL | Status: DC

## 2018-10-23 MED ORDER — HYDROCODONE-ACETAMINOPHEN 5-325 MG PO TABS
1.0000 | ORAL_TABLET | ORAL | Status: DC | PRN
  Administered 2018-10-23: 1 via ORAL
  Filled 2018-10-23: qty 1

## 2018-10-23 NOTE — ED Notes (Signed)
Patient transported to CT with sitter.

## 2018-10-23 NOTE — Discharge Instructions (Signed)
As discussed, it is very important to keep your follow-ups visit scheduled for tomorrow. If you do develop new, or concerning changes, or any thoughts that are concerning or dangerous, please be sure to return here immediately.

## 2018-10-23 NOTE — ED Notes (Signed)
Charge updated on plan with pt, ok with charge for pt to have phone
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
## 2018-10-23 NOTE — ED Provider Notes (Signed)
[facility] EMERGENCY DEPARTMENT Provider Note   CSN: [id number] Arrival date & time: 10/23/18  1257     History   Chief Complaint Chief Complaint  Patient presents with  . Suicidal  . Hallucinations    HPI Shawn Potts is a 52 y.o. male.  HPI Patient presents with suicidal ideation, homicidal ideation.  On his chronic neck pain, headache, denies history of psychiatric disease. He is here with a colleague who contributes to the HPI The colleague notes that the patient recently separated from his wife, and has a substantial increase in stress. The colleague states patient has never appeared in this condition before. The patient himself states that he has both suicidal and homicidal ideation.  No hallucination. No recent change in medication, diet. He acknowledges change in life stress levels. He denies new pain.  Past Medical History:  Diagnosis Date  . Hypertension   . Seizures [facility])     Patient Active Problem List   Diagnosis Date Noted  . Chest pain 02/06/2012  . HTN (hypertension) 02/06/2012  . Seizure disorder (HCC) 02/06/2012  . Tobacco abuse 02/06/2012    Past Surgical History:  Procedure Laterality Date  . BRAIN SURGERY  x 4  . BRAIN SURGERY          Home Medications    Prior to Admission medications   Medication Sig Start Date End Date Taking? Authorizing Provider  amLODipine (NORVASC) 10 MG tablet Take 10 mg by mouth daily.    Yes [provider]  atorvastatin (LIPITOR) 20 MG tablet Take 20 mg by mouth daily. 07/16/18  Yes [provider]  lisinopril-hydrochlorothiazide (PRINZIDE,ZESTORETIC) 20-25 MG per tablet Take 1 tablet by mouth daily.  11/19/13  Yes [provider]  Oxycodone HCl 10 MG TABS Take 10 mg by mouth 5 (five) times daily.  08/13/18  Yes [provider]    Family History No family history on file.  Social History Social History   Tobacco Use  . Smoking status: Current  Every Day Smoker    Packs/day: 1.00  . Smokeless tobacco: Never Used  Substance Use Topics  . Alcohol use: No  . Drug use: Yes    Types: Marijuana    Comment: used last pm     Allergies   Levetiracetam and Nsaids   Review of Systems Review of Systems  Constitutional:       Per HPI, otherwise negative  HENT:       Per HPI, otherwise negative  Respiratory:       Per HPI, otherwise negative  Cardiovascular:       Per HPI, otherwise negative  Gastrointestinal: Negative for vomiting.  Endocrine:       Negative aside from HPI  Genitourinary:       Neg aside from HPI   Musculoskeletal:       Per HPI, otherwise negative  Skin: Negative.   Neurological: Negative for syncope.  Psychiatric/Behavioral: Positive for dysphoric mood and suicidal ideas. The patient is nervous/anxious.      Physical Exam Updated Vital Signs BP (!) 160/97 (BP Location: Right Arm)   Pulse 66   Temp 98.2 F (36.8 C) (Oral)   Resp 20   Ht 5\' 7"  (1.702 m)   Wt 69.4 kg   SpO2 99%   BMI 23.96 kg/m   Physical Exam Vitals signs and nursing note reviewed.  Constitutional:      General: He is not in acute distress.    Appearance: He  is well-developed.  HENT:     Head: Normocephalic and atraumatic.  Eyes:     Conjunctiva/sclera: Conjunctivae normal.  Cardiovascular:     Rate and Rhythm: Normal rate and regular rhythm.  Pulmonary:     Effort: Pulmonary effort is normal. No respiratory distress.     Breath sounds: No stridor.  Abdominal:     General: There is no distension.  Skin:    General: Skin is warm and dry.  Neurological:     Mental Status: He is alert and oriented to person, place, and time.  Psychiatric:        Behavior: Behavior is slowed.        Thought Content: Thought content includes homicidal and suicidal ideation.      ED Treatments / Results  Labs (all labs ordered are listed, but only abnormal results are displayed) Labs Reviewed  COMPREHENSIVE METABOLIC PANEL -  Abnormal; Notable for the following components:      Result Value   Glucose, Bld 108 (*)    Creatinine, Ser 1.43 (*)    GFR calc non Af Amer 56 (*)    All other components within normal limits  ACETAMINOPHEN LEVEL - Abnormal; Notable for the following components:   Acetaminophen (Tylenol), Serum <10 (*)    All other components within normal limits  CBC - Abnormal; Notable for the following components:   Hemoglobin 12.8 (*)    All other components within normal limits  ETHANOL  SALICYLATE LEVEL  RAPID URINE DRUG SCREEN, HOSP PERFORMED    EKG None  Radiology Ct Head Wo Contrast  Result Date: 10/23/2018 CLINICAL DATA:  Altered level of consciousness EXAM: CT HEAD WITHOUT CONTRAST TECHNIQUE: Contiguous axial images were obtained from the base of the skull through the vertex without intravenous contrast. COMPARISON:  08/27/2018 FINDINGS: Brain: No evidence of acute infarction, hemorrhage, extra-axial collection, ventriculomegaly, or mass effect. Right frontal lobe encephalomalacia. Small area of encephalomalacia in the left inferior temporal lobe. 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 Vascular: Cerebrovascular atherosclerotic calcifications are noted. Skull: Prior right craniotomy. Sinuses/Orbits: Visualized portions of the orbits are unremarkable. Visualized portions of the paranasal sinuses and mastoid air cells are unremarkable. Other: None. IMPRESSION: No acute intracranial pathology. Electronically Signed   By: Elige Ko   On: 10/23/2018 16:38    Procedures Procedures (including critical care time)  Medications Ordered in ED Medications  amLODipine (NORVASC) tablet 5 mg (has no administration in time range)  HYDROcodone-acetaminophen (NORCO/VICODIN) 5-325 MG per tablet 1 tablet (1 tablet Oral Given 10/23/18 1648)  lisinopril (PRINIVIL,ZESTRIL) tablet 20 mg (has no administration in time range)    And  hydrochlorothiazide (HYDRODIURIL) tablet 25 mg (has no administration in  time range)     Initial Impression / Assessment and Plan / ED Course  I have reviewed the triage vital signs and the nursing notes.  Pertinent labs & imaging results that were available during my care of the patient were reviewed by me and considered in my medical decision making (see chart for details).   This patient presents with the need for medical evaluation due to ongoing psychiatric condition.  The patient's medical portion of the evaluation is generally reassuring, with no evidence of acute new pathology.  The patient has been medically cleared for further psychiatric evaluation.  Update: Patient now states that his suicidal ideation has resolved, he is upset about being here in the first place, notes that he is a physician visit scheduled for tomorrow morning, approximately 17 hours  from now. Patient has been seen by behavioral health colleagues who suggest the patient be admitted for further evaluation, management.  Update:, Patient continues to state that he has no suicidal ideation, no physical complaints, is upset with his current situation. He states that he has 7 grandchildren, has strong motivation to live for them, as well as other family members. We contracted for safety, and the patient is now accompanied by a nephew with whom the patient is going to be discharged.  Nephew notes that the patient will be and family company until his visit tomorrow with psychiatry Given his resolution of suicidal ideation, recognized precipitant of upsetting situation with his wife, as well as medical issues, but with resolution of issues, and after contracting for safety, patient is discharged in stable condition.  Final Clinical Impressions(s) / ED Diagnoses   Final diagnoses:  Agitation     Gerhard MunchLockwood, Takesha Steger, MD 10/23/18 1710

## 2018-10-23 NOTE — ED Notes (Addendum)
Pt expressing strong desire to leave and states that family is on the way to get him. Plan of care discussed with EDP Dr Jeraldine LootsLockwood, no IVC at this time. Contracting for safety and will have conversation with family when the family arrives. Dr Jeraldine LootsLockwood also permitting pt to keep cell phone at bedside.  BH aware of this plan

## 2018-10-23 NOTE — ED Notes (Signed)
Shawn Potts-- [id number]  Pts family member

## 2018-10-23 NOTE — ED Notes (Signed)
MD at bedside discussing plan of care with nephew and pt, pt contracting for safety at this time

## 2018-10-23 NOTE — ED Triage Notes (Signed)
Pt reports SI/HI "for 2 years". Pt also reports hearing voices to kill people.

## 2018-10-23 NOTE — ED Notes (Signed)
Pt [facility] scrubs and wanded by security. Belongings placed in bags and are currently at green zone desk.

## 2019-09-27 IMAGING — CT CT HEAD W/O CM
3 series · 15 of 47 positions shown, 18 images · non-contrast
Comparison: October 24, 2012

CLINICAL DATA: Previous surgery for brain tumor. History of
seizures. Chronic headache

EXAM:
CT HEAD WITHOUT CONTRAST
TECHNIQUE: Contiguous axial images were obtained from the base of the skull
through the vertex without intravenous contrast.

[Series 2: head wo · axial · 0.42mm/px · z∈[+294,+419]mm · 9 of 30 slices shown, 12 images]
[im 3/30  brain]
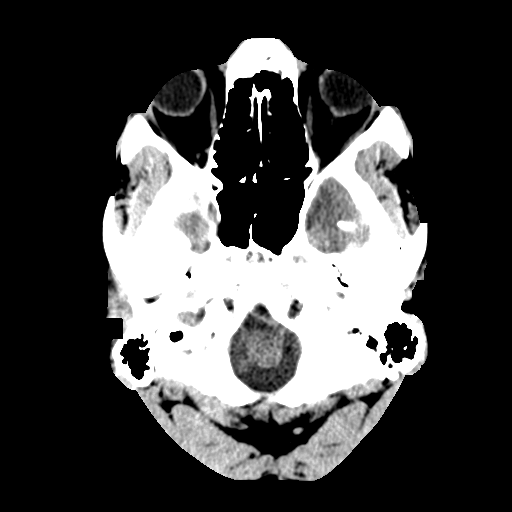
[im 3/30  bone]
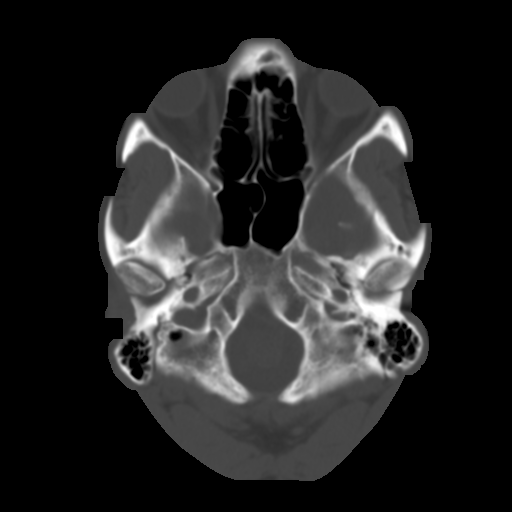
[im 6/30  brain]
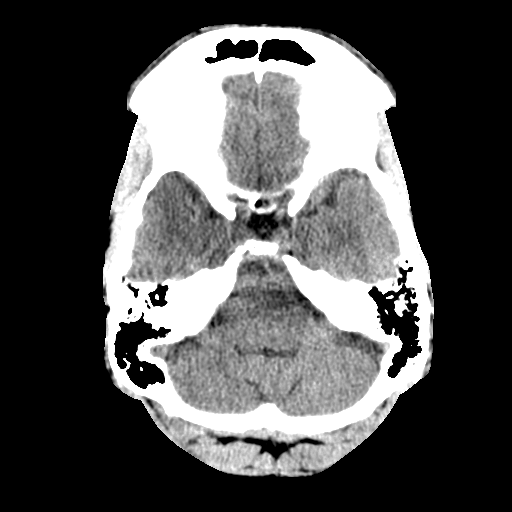
[im 9/30  brain]
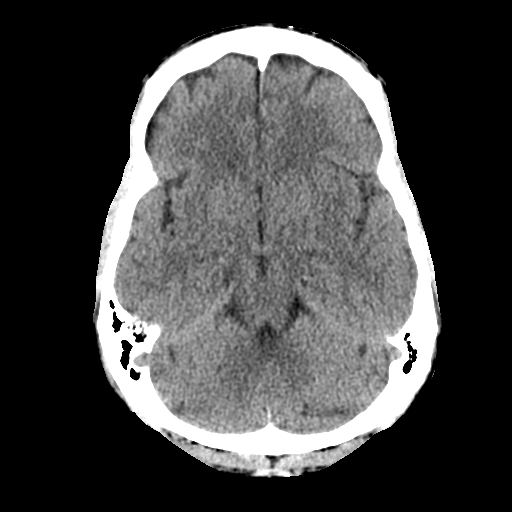
[im 12/30  brain]
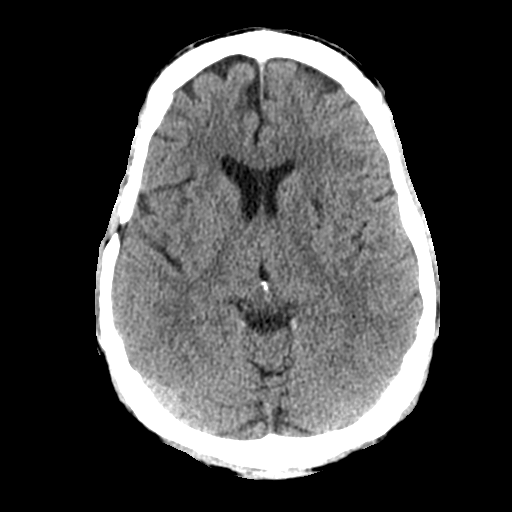
[im 16/30  brain]
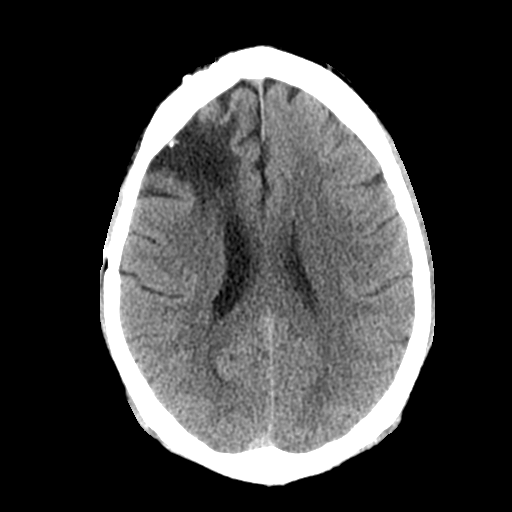
[im 16/30  bone]
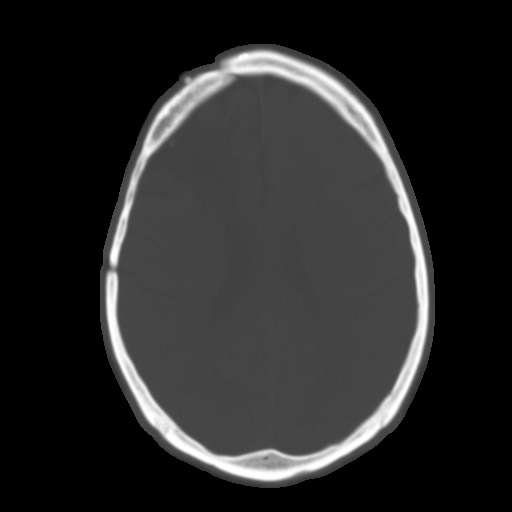
[im 19/30  brain]
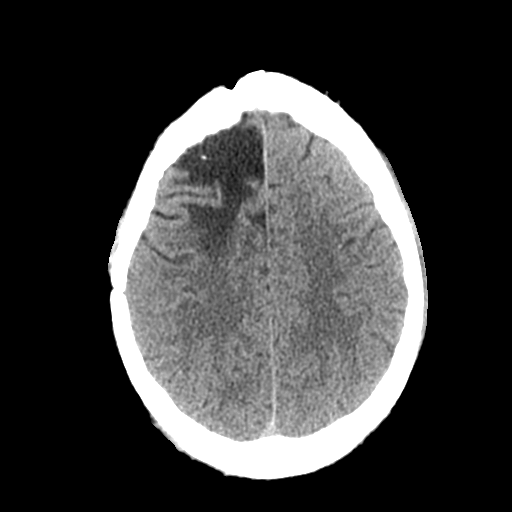
[im 22/30  brain]
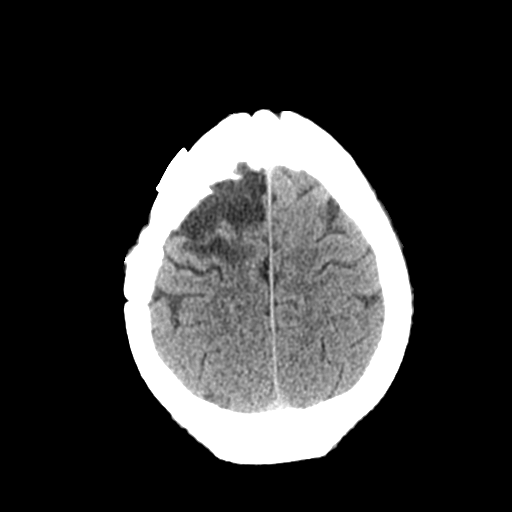
[im 25/30  brain]
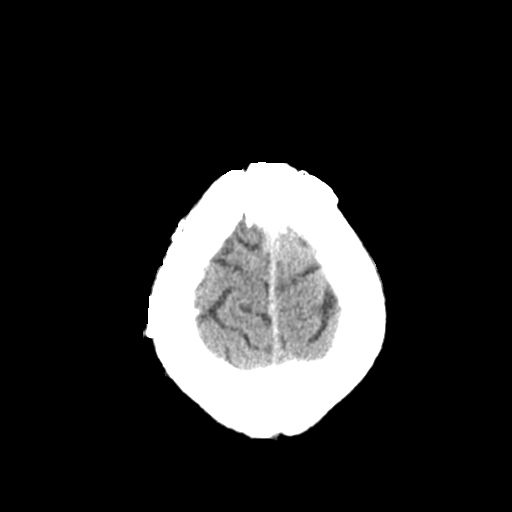
[im 28/30  brain]
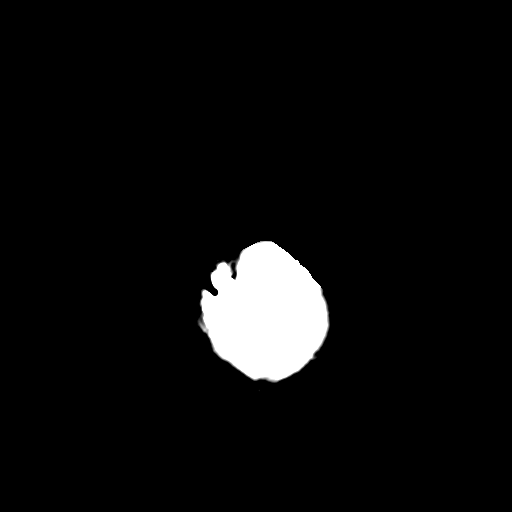
[im 28/30  bone]
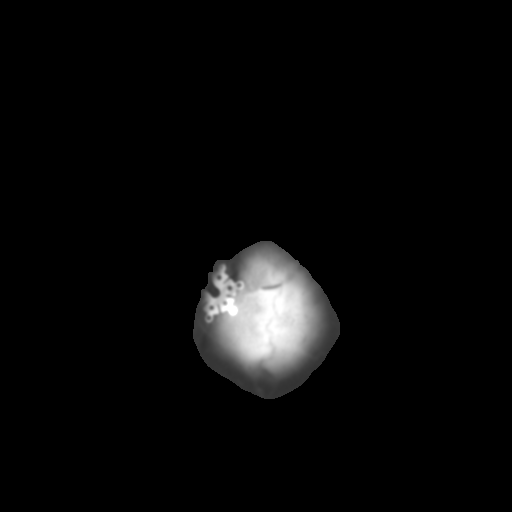

[Series 4: coronal soft tissue · coronal · 0.31mm/px · 3 of 68 slices shown]
[im 23/68  brain]
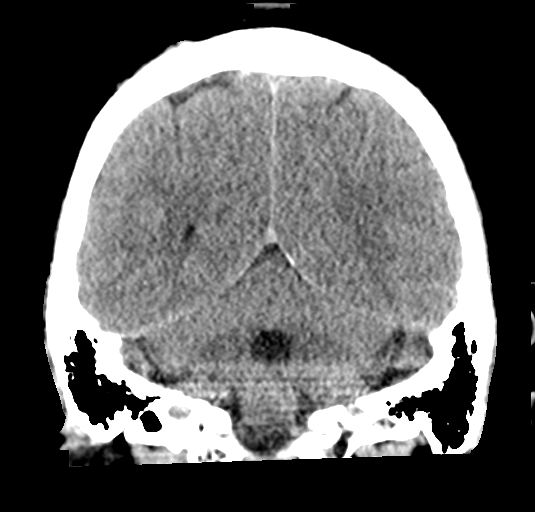
[im 30/68  brain]
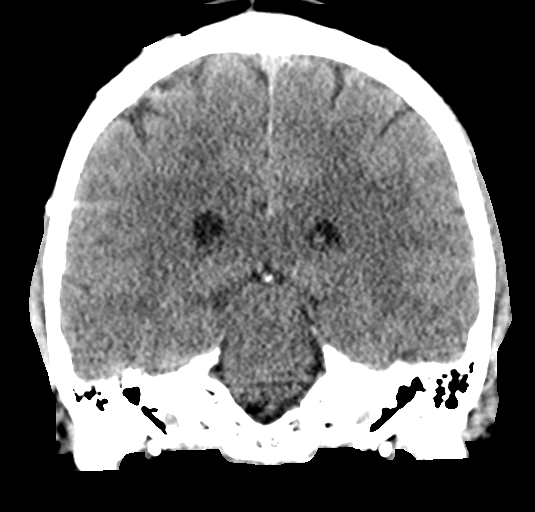
[im 38/68  brain]
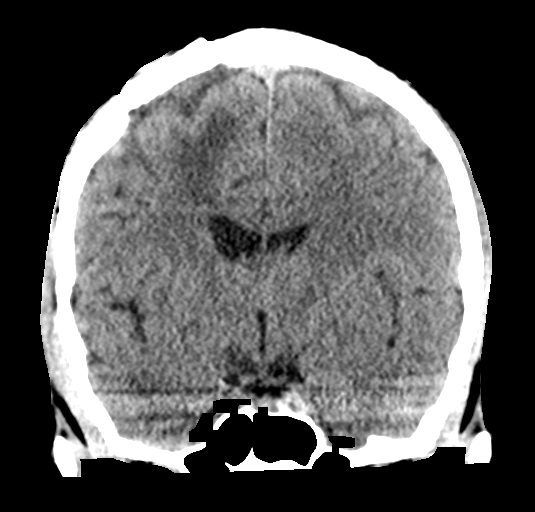

[Series 5: sagittal soft tissue · sagittal · 0.31mm/px · 3 of 56 slices shown]
[im 19/56  brain]
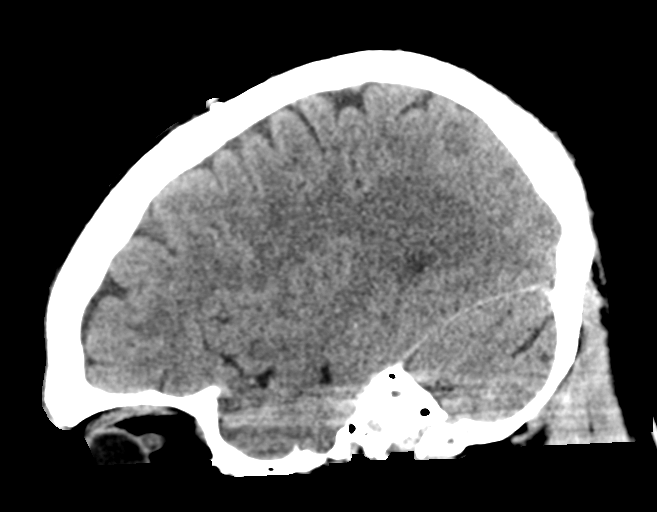
[im 28/56  brain]
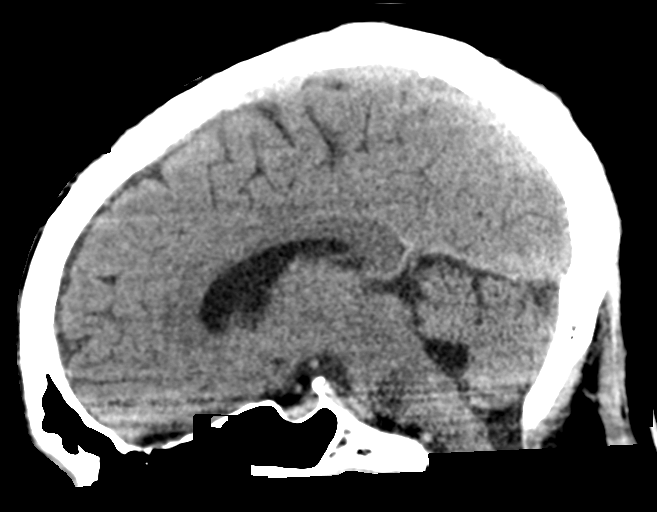
[im 37/56  brain]
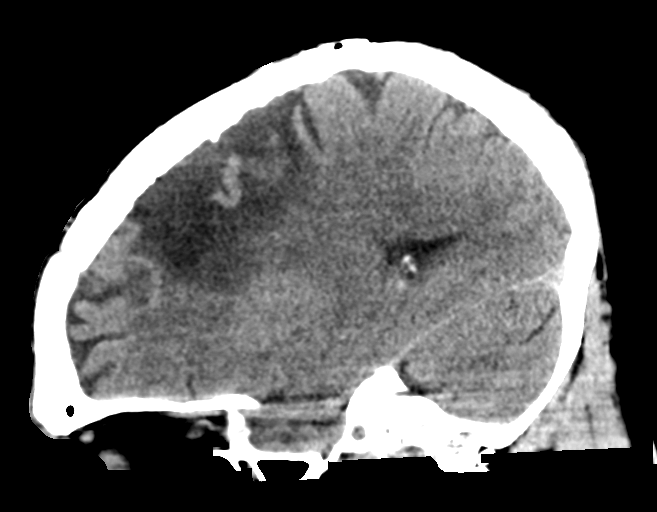

[15 of 47 positions shown; findings below may reference images not displayed]

FINDINGS: Brain: The ventricles and sulci appear unremarkable and stable.
There is evidence of a prior infarct with encephalomalacia in the
superior anterior right frontal lobe with occasional calcifications
in this area, stable. There is postoperative change in the nearby
bony calvarium. There is no mass, hemorrhage, extra-axial fluid
collection, or midline shift. There is slight small vessel disease
in the anterior centra semiovale bilaterally. No acute infarct is
evident on this study.

Vascular: No hyperdense vessel.  No evident vascular calcification.

Skull: Postoperative changes in the right frontal and temporal bones
are again noted. Bony calvarium otherwise intact.

Sinuses/Orbits: There is mucosal thickening in several ethmoid air
cells. Other visualized paranasal sinuses are clear. Visualized
orbits appear symmetric bilaterally.

Other: Visualized mastoid air cells are clear.
IMPRESSION: Stable changes of encephalomalacia/prior infarct right anterior
superior frontal lobe with occasional calcifications in this area,
stable. Postoperative changes in the nearby bony calvarium noted.
Elsewhere there is mild periventricular small vessel disease. No
evident acute infarct. No mass or hemorrhage.

Mucosal thickening noted in several ethmoid air cells.

## 2020-07-29 ENCOUNTER — Emergency Department (HOSPITAL_COMMUNITY)
Admission: EM | Admit: 2020-07-29 | Discharge: 2020-07-29 | Disposition: A | Discharge status: Home / Self Care | Payer: Medicare (Managed Care) | Attending: Emergency Medicine | Admitting: Emergency Medicine

## 2020-07-29 ENCOUNTER — Emergency Department (HOSPITAL_COMMUNITY): Payer: Medicare (Managed Care)

## 2020-07-29 ENCOUNTER — Encounter (HOSPITAL_COMMUNITY): Payer: Self-pay | Admitting: Emergency Medicine

## 2020-07-29 DIAGNOSIS — M545 Low back pain: Secondary | ICD-10-CM | POA: Insufficient documentation

## 2020-07-29 DIAGNOSIS — M542 Cervicalgia: Secondary | ICD-10-CM | POA: Diagnosis not present

## 2020-07-29 DIAGNOSIS — I10 Essential (primary) hypertension: Secondary | ICD-10-CM | POA: Diagnosis not present

## 2020-07-29 DIAGNOSIS — Y92410 Unspecified street and highway as the place of occurrence of the external cause: Secondary | ICD-10-CM | POA: Insufficient documentation

## 2020-07-29 DIAGNOSIS — Z79899 Other long term (current) drug therapy: Secondary | ICD-10-CM | POA: Insufficient documentation

## 2020-07-29 DIAGNOSIS — F17200 Nicotine dependence, unspecified, uncomplicated: Secondary | ICD-10-CM | POA: Diagnosis not present

## 2020-07-29 DIAGNOSIS — M7918 Myalgia, other site: Secondary | ICD-10-CM
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
## 2020-07-29 NOTE — Discharge Instructions (Addendum)
Please refer to attached instructions. Tylenol or ibuprofen for discomfort.
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
## 2020-07-29 NOTE — ED Notes (Signed)
D. Smith, NP at bedside.  

## 2020-08-05 ENCOUNTER — Ambulatory Visit: Payer: Self-pay | Admitting: Surgery

## 2020-08-05 NOTE — H&P (Signed)
Surgical Evaluation  HPI: This is a very pleasant 54 year old man with a lifelong umbilical hernia. It has become more painful in recent years, and he thinks it may have increased in size slightly. He notices it when he was at his grandson or when he does any sort of activity uses his abdominal musculature. At first this pain was transient and localize, but now the pain lasts longer and seems to involve more of the lower abdomen as well. Denies any associated GI or urinary symptoms. Reports normal appetite, normal bowel function and normal bladder function. Denies recent weight loss. He has had no prior abdominal surgery. He does have a history of epilepsy and has had multiple procedures for that. He does smoke about a half pack a day.  Allergies  Allergen Reactions  . Levetiracetam Other (See Comments)    Pt states, "When I take Keppra it makes me feel like I am having a heart attack."   . Nsaids Other (See Comments)    Pt reports aspirin and ibuprofen induce seizures    Past Medical History:  Diagnosis Date  . Hypertension   . Seizures (HCC)     Past Surgical History:  Procedure Laterality Date  . BRAIN SURGERY  x 4  . BRAIN SURGERY      No family history on file.  Social History   Socioeconomic History  . Marital status: Married    Spouse name: Not on file  . Number of children: Not on file  . Years of education: Not on file  . Highest education level: Not on file  Occupational History  . Not on file  Tobacco Use  . Smoking status: Current Every Day Smoker    Packs/day: 1.00  . Smokeless tobacco: Never Used  Substance and Sexual Activity  . Alcohol use: No  . Drug use: Yes    Types: Marijuana    Comment: used last pm  . Sexual activity: Not on file  Other Topics Concern  . Not on file  Social History Narrative  . Not on file   Social Determinants of Health   Financial Resource Strain:   . Difficulty of Paying Living Expenses: Not on file  Food  Insecurity:   . Worried About Programme researcher, broadcasting/film/video in the Last Year: Not on file  . Ran Out of Food in the Last Year: Not on file  Transportation Needs:   . Lack of Transportation (Medical): Not on file  . Lack of Transportation (Non-Medical): Not on file  Physical Activity:   . Days of Exercise per Week: Not on file  . Minutes of Exercise per Session: Not on file  Stress:   . Feeling of Stress : Not on file  Social Connections:   . Frequency of Communication with Friends and Family: Not on file  . Frequency of Social Gatherings with Friends and Family: Not on file  . Attends Religious Services: Not on file  . Active Member of Clubs or Organizations: Not on file  . Attends Banker Meetings: Not on file  . Marital Status: Not on file    Current Outpatient Medications on File Prior to Visit  Medication Sig Dispense Refill  . amLODipine (NORVASC) 10 MG tablet Take 10 mg by mouth daily.     Marland Kitchen atorvastatin (LIPITOR) 20 MG tablet Take 20 mg by mouth daily.  3  . lisinopril-hydrochlorothiazide (PRINZIDE,ZESTORETIC) 20-25 MG per tablet Take 1 tablet by mouth daily.     . Oxycodone HCl  10 MG TABS Take 10 mg by mouth 5 (five) times daily.   0   No current facility-administered medications on file prior to visit.    Review of Systems: a complete, 10pt review of systems was completed with pertinent positives and negatives as documented in the HPI  Physical Exam: Vitals  Weight: 162 lb Height: 68in Body Surface Area: 1.87 m Body Mass Index: 24.63 kg/m  Temp.: 97.59F  Pulse: 55 (Regular)   Alert and well-appearing. Unlabored respirations. Abdomen is soft, nondistended. There is a chronically incarcerated umbilical hernia which is mildly tender.   CBC Latest Ref Rng & Units 10/23/2018 08/27/2018 06/23/2018  WBC 4.0 - 10.5 K/uL 8.2 9.5 7.4  Hemoglobin 13.0 - 17.0 g/dL 12.8(L) 14.3 13.1  Hematocrit 39 - 52 % 39.6 42.1 40.5  Platelets 150 - 400 K/uL 360 369 339     CMP Latest Ref Rng & Units 10/23/2018 08/27/2018 06/23/2018  Glucose 70 - 99 mg/dL 122(Q) 825(O) 037(C)  BUN 6 - 20 mg/dL 14 48(G) 89(V)  Creatinine 0.61 - 1.24 mg/dL 6.94(H) 0.38 8.82  Sodium 135 - 145 mmol/L 139 139 137  Potassium 3.5 - 5.1 mmol/L 3.7 4.6 4.4  Chloride 98 - 111 mmol/L 103 100 102  CO2 22 - 32 mmol/L 25 31 28   Calcium 8.9 - 10.3 mg/dL 9.1 9.4 9.0  Total Protein 6.5 - 8.1 g/dL 6.8 8.1 7.0  Total Bilirubin 0.3 - 1.2 mg/dL 0.5 0.6 0.7  Alkaline Phos 38 - 126 U/L 66 71 72  AST 15 - 41 U/L 19 21 23   ALT 0 - 44 U/L 21 22 18     No results found for: INR, PROTIME  Imaging: No results found.   A/P: INCARCERATED UMBILICAL HERNIA (K42.0) Story: Discussed options for repair, I recommended an open approach and we discussed the procedure in detail and went over risks of bleeding, infection, pain, scarring, injury to intra-abdominal structures, wound healing problems, cardiovascular/thromboembolic/pulmonary complications, and his case increased risk of hernia recurrence given his tobacco abuse. Questions were open and answered. He wishes to proceed as soon as possible.    Patient Active Problem List   Diagnosis Date Noted  . Chest pain 02/06/2012  . HTN (hypertension) 02/06/2012  . Seizure disorder (HCC) 02/06/2012  . Tobacco abuse 02/06/2012       04/07/2012, MD [facility]  See AMION to contact appropriate on-call provider

## 2020-09-09 ENCOUNTER — Encounter (HOSPITAL_BASED_OUTPATIENT_CLINIC_OR_DEPARTMENT_OTHER): Payer: Self-pay | Admitting: Surgery

## 2020-09-09 ENCOUNTER — Other Ambulatory Visit: Payer: Self-pay

## 2020-09-09 NOTE — Progress Notes (Signed)
Spoke w/ via phone for pre-op interview---pt Lab needs dos----  I stat 8, ekg             Lab results------lov neurology dr Malvin Johns 03-07-2019 care [facility]  COVID test -----09-15-2020 1500- Arrive at -------830 09-18-2020 NPO after MN NO Solid Food.  Clear liquids from MN until---730 am Medications to take morning of surgery -----oxycodone, atorvastatin, amlodipine, bring in halers Diabetic medication -----n/a Patient Special Instructions -----none Pre-Op special Istructions -----none Patient verbalized understanding of instructions that were given at this phone interview. Patient denies shortness of breath, chest pain, fever, cough at this phone interview.

## 2020-09-15 ENCOUNTER — Other Ambulatory Visit (HOSPITAL_COMMUNITY)
Admission: RE | Admit: 2020-09-15 | Discharge: 2020-09-15 | Disposition: A | Discharge status: Home / Self Care | Payer: Medicare (Managed Care) | Source: Ambulatory Visit | Attending: Surgery | Admitting: Surgery

## 2020-09-15 DIAGNOSIS — Z20822 Contact with and (suspected) exposure to covid-19: Secondary | ICD-10-CM | POA: Insufficient documentation

## 2020-09-15 DIAGNOSIS — Z01818 Encounter for other preprocedural examination: Secondary | ICD-10-CM | POA: Diagnosis present

## 2020-09-15 LAB — SARS CORONAVIRUS 2 (TAT 6-24 HRS): SARS Coronavirus 2: NEGATIVE

## 2020-09-17 NOTE — H&P (Signed)
Surgical Evaluation  HPI: This is a very pleasant 54 year old man with a lifelong umbilical hernia. It has become more painful in recent years, and he thinks it may have increased in size slightly. He notices it when he was at his grandson or when he does any sort of activity uses his abdominal musculature. At first this pain was transient and localize, but now the pain lasts longer and seems to involve more of the lower abdomen as well. Denies any associated GI or urinary symptoms. Reports normal appetite, normal bowel function and normal bladder function. Denies recent weight loss. He has had no prior abdominal surgery. He does have a history of epilepsy and has had multiple procedures for that. He does smoke about a half pack a day.       Allergies  Allergen Reactions  . Levetiracetam Other (See Comments)    Pt states, "When I take Keppra it makes me feel like I am having a heart attack."   . Nsaids Other (See Comments)    Pt reports aspirin and ibuprofen induce seizures        Past Medical History:  Diagnosis Date  . Hypertension   . Seizures (HCC)          Past Surgical History:  Procedure Laterality Date  . BRAIN SURGERY  x 4  . BRAIN SURGERY      No family history on file.  Social History        Socioeconomic History  . Marital status: Married    Spouse name: Not on file  . Number of children: Not on file  . Years of education: Not on file  . Highest education level: Not on file  Occupational History  . Not on file  Tobacco Use  . Smoking status: Current Every Day Smoker    Packs/day: 1.00  . Smokeless tobacco: Never Used  Substance and Sexual Activity  . Alcohol use: No  . Drug use: Yes    Types: Marijuana    Comment: used last pm  . Sexual activity: Not on file  Other Topics Concern  . Not on file  Social History Narrative  . Not on file   Social Determinants of Health      Financial Resource Strain:   .  Difficulty of Paying Living Expenses: Not on file  Food Insecurity:   . Worried About Programme researcher, broadcasting/film/video in the Last Year: Not on file  . Ran Out of Food in the Last Year: Not on file  Transportation Needs:   . Lack of Transportation (Medical): Not on file  . Lack of Transportation (Non-Medical): Not on file  Physical Activity:   . Days of Exercise per Week: Not on file  . Minutes of Exercise per Session: Not on file  Stress:   . Feeling of Stress : Not on file  Social Connections:   . Frequency of Communication with Friends and Family: Not on file  . Frequency of Social Gatherings with Friends and Family: Not on file  . Attends Religious Services: Not on file  . Active Member of Clubs or Organizations: Not on file  . Attends Banker Meetings: Not on file  . Marital Status: Not on file          Current Outpatient Medications on File Prior to Visit  Medication Sig Dispense Refill  . amLODipine (NORVASC) 10 MG tablet Take 10 mg by mouth daily.     Marland Kitchen atorvastatin (LIPITOR) 20 MG tablet Take  20 mg by mouth daily.  3  . lisinopril-hydrochlorothiazide (PRINZIDE,ZESTORETIC) 20-25 MG per tablet Take 1 tablet by mouth daily.     . Oxycodone HCl 10 MG TABS Take 10 mg by mouth 5 (five) times daily.   0   No current facility-administered medications on file prior to visit.    Review of Systems: a complete, 10pt review of systems was completed with pertinent positives and negatives as documented in the HPI  Physical Exam: Vitals  Weight: 162 lb Height: 68in Body Surface Area: 1.87 m Body Mass Index: 24.63 kg/m  Temp.: 97.56F  Pulse: 55 (Regular)   Alert and well-appearing. Unlabored respirations. Abdomen is soft, nondistended. There is a chronically incarcerated umbilical hernia which is mildly tender.   CBC Latest Ref Rng & Units 10/23/2018 08/27/2018 06/23/2018  WBC 4.0 - 10.5 K/uL 8.2 9.5 7.4  Hemoglobin 13.0 - 17.0 g/dL 12.8(L) 14.3 13.1   Hematocrit 39 - 52 % 39.6 42.1 40.5  Platelets 150 - 400 K/uL 360 369 339    CMP Latest Ref Rng & Units 10/23/2018 08/27/2018 06/23/2018  Glucose 70 - 99 mg/dL 081(K) 481(E) 563(J)  BUN 6 - 20 mg/dL 14 49(F) 02(O)  Creatinine 0.61 - 1.24 mg/dL 3.78(H) 8.85 0.27  Sodium 135 - 145 mmol/L 139 139 137  Potassium 3.5 - 5.1 mmol/L 3.7 4.6 4.4  Chloride 98 - 111 mmol/L 103 100 102  CO2 22 - 32 mmol/L 25 31 28   Calcium 8.9 - 10.3 mg/dL 9.1 9.4 9.0  Total Protein 6.5 - 8.1 g/dL 6.8 8.1 7.0  Total Bilirubin 0.3 - 1.2 mg/dL 0.5 0.6 0.7  Alkaline Phos 38 - 126 U/L 66 71 72  AST 15 - 41 U/L 19 21 23   ALT 0 - 44 U/L 21 22 18     Recent Labs  No results found for: INR, PROTIME    Imaging: Imaging Results (Last 48 hours)  No results found.     A/P: INCARCERATED UMBILICAL HERNIA (K42.0) Story: Discussed options for repair, I recommended an open approach and we discussed the procedure in detail and went over risks of bleeding, infection, pain, scarring, injury to intra-abdominal structures, wound healing problems, cardiovascular/thromboembolic/pulmonary complications, and his case increased risk of hernia recurrence given his tobacco abuse. Questions were open and answered. He wishes to proceed as soon as possible.        Patient Active Problem List   Diagnosis Date Noted  . Chest pain 02/06/2012  . HTN (hypertension) 02/06/2012  . Seizure disorder (HCC) 02/06/2012  . Tobacco abuse 02/06/2012       04/07/2012, MD [facility], 04/07/2012

## 2020-09-18 ENCOUNTER — Encounter (HOSPITAL_BASED_OUTPATIENT_CLINIC_OR_DEPARTMENT_OTHER)
Admission: RE | Disposition: A | Discharge status: Home / Self Care | Payer: Self-pay | Source: Home / Self Care | Attending: Surgery

## 2020-09-18 ENCOUNTER — Ambulatory Visit (HOSPITAL_BASED_OUTPATIENT_CLINIC_OR_DEPARTMENT_OTHER): Payer: Medicare (Managed Care) | Admitting: Anesthesiology

## 2020-09-18 ENCOUNTER — Other Ambulatory Visit: Payer: Self-pay

## 2020-09-18 ENCOUNTER — Ambulatory Visit (HOSPITAL_BASED_OUTPATIENT_CLINIC_OR_DEPARTMENT_OTHER)
Admission: RE | Admit: 2020-09-18 | Discharge: 2020-09-18 | Disposition: A | Discharge status: Home / Self Care | Payer: Medicare (Managed Care) | Attending: Surgery | Admitting: Surgery

## 2020-09-18 ENCOUNTER — Encounter (HOSPITAL_BASED_OUTPATIENT_CLINIC_OR_DEPARTMENT_OTHER): Payer: Self-pay | Admitting: Surgery

## 2020-09-18 DIAGNOSIS — J45909 Unspecified asthma, uncomplicated: Secondary | ICD-10-CM | POA: Diagnosis not present

## 2020-09-18 DIAGNOSIS — Z79899 Other long term (current) drug therapy: Secondary | ICD-10-CM | POA: Diagnosis not present

## 2020-09-18 DIAGNOSIS — F17200 Nicotine dependence, unspecified, uncomplicated: Secondary | ICD-10-CM | POA: Diagnosis not present

## 2020-09-18 DIAGNOSIS — G40909 Epilepsy, unspecified, not intractable, without status epilepticus: Secondary | ICD-10-CM | POA: Diagnosis not present

## 2020-09-18 DIAGNOSIS — Z6824 Body mass index (BMI) 24.0-24.9, adult: Secondary | ICD-10-CM | POA: Diagnosis not present

## 2020-09-18 DIAGNOSIS — Z888 Allergy status to other drugs, medicaments and biological substances status: Secondary | ICD-10-CM | POA: Diagnosis not present

## 2020-09-18 DIAGNOSIS — K420 Umbilical hernia with obstruction, without gangrene: Secondary | ICD-10-CM | POA: Diagnosis not present

## 2020-09-18 DIAGNOSIS — E669 Obesity, unspecified: Secondary | ICD-10-CM | POA: Insufficient documentation

## 2020-09-18 DIAGNOSIS — I10 Essential (primary) hypertension: Secondary | ICD-10-CM | POA: Diagnosis not present

## 2020-09-18 HISTORY — DX: Pure hypercholesterolemia, unspecified: E78.00

## 2020-09-18 HISTORY — DX: Unspecified asthma, uncomplicated: J45.909

## 2020-09-18 HISTORY — PX: UMBILICAL HERNIA REPAIR: SHX196

## 2020-09-18 HISTORY — DX: Umbilical hernia without obstruction or gangrene: K42.9

## 2020-09-18 LAB — POCT I-STAT, CHEM 8
BUN: 31 mg/dL — ABNORMAL HIGH (ref 6–20)
Calcium, Ion: 1.15 mmol/L (ref 1.15–1.40)
Chloride: 101 mmol/L (ref 98–111)
Creatinine, Ser: 1.4 mg/dL — ABNORMAL HIGH (ref 0.61–1.24)
Glucose, Bld: 112 mg/dL — ABNORMAL HIGH (ref 70–99)
HCT: 46 % (ref 39.0–52.0)
Hemoglobin: 15.6 g/dL (ref 13.0–17.0)
Potassium: 3.4 mmol/L — ABNORMAL LOW (ref 3.5–5.1)
Sodium: 139 mmol/L (ref 135–145)
TCO2: 26 mmol/L (ref 22–32)

## 2020-09-18 SURGERY — REPAIR, HERNIA, UMBILICAL, ADULT
Anesthesia: General | Site: Abdomen

## 2020-09-18 MED ORDER — PROPOFOL 10 MG/ML IV BOLUS
INTRAVENOUS | Status: DC | PRN
  Administered 2020-09-18: 150 mg via INTRAVENOUS

## 2020-09-18 MED ORDER — ACETAMINOPHEN 325 MG PO TABS: 325.0000 mg | ORAL_TABLET | Freq: Once | ORAL | Status: DC | PRN

## 2020-09-18 MED ORDER — CHLORHEXIDINE GLUCONATE 4 % EX LIQD: 60.0000 mL | Freq: Once | CUTANEOUS | Status: DC

## 2020-09-18 MED ORDER — FENTANYL CITRATE (PF) 100 MCG/2ML IJ SOLN
INTRAMUSCULAR | Status: DC | PRN
  Administered 2020-09-18: 100 ug via INTRAVENOUS

## 2020-09-18 MED ORDER — HYDROMORPHONE HCL 1 MG/ML IJ SOLN
INTRAMUSCULAR | Status: AC
  Filled 2020-09-18: qty 1

## 2020-09-18 MED ORDER — ACETAMINOPHEN 325 MG RE SUPP: 650.0000 mg | RECTAL | Status: DC | PRN

## 2020-09-18 MED ORDER — SUGAMMADEX SODIUM 200 MG/2ML IV SOLN
INTRAVENOUS | Status: DC | PRN
  Administered 2020-09-18: 150 mg via INTRAVENOUS

## 2020-09-18 MED ORDER — ACETAMINOPHEN 160 MG/5ML PO SOLN: 325.0000 mg | Freq: Once | ORAL | Status: DC | PRN

## 2020-09-18 MED ORDER — MIDAZOLAM HCL 2 MG/2ML IJ SOLN
INTRAMUSCULAR | Status: DC | PRN
  Administered 2020-09-18: 2 mg via INTRAVENOUS

## 2020-09-18 MED ORDER — BUPIVACAINE LIPOSOME 1.3 % IJ SUSP: 20.0000 mL | Freq: Once | INTRAMUSCULAR | Status: DC

## 2020-09-18 MED ORDER — ACETAMINOPHEN 10 MG/ML IV SOLN: 1000.0000 mg | Freq: Once | INTRAVENOUS | Status: DC | PRN

## 2020-09-18 MED ORDER — GABAPENTIN 300 MG PO CAPS
ORAL_CAPSULE | ORAL | Status: AC
  Filled 2020-09-18: qty 1

## 2020-09-18 MED ORDER — SODIUM CHLORIDE 0.9 % IV SOLN: 250.0000 mL | INTRAVENOUS | Status: DC | PRN

## 2020-09-18 MED ORDER — BUPIVACAINE-EPINEPHRINE 0.25% -1:200000 IJ SOLN
INTRAMUSCULAR | Status: DC | PRN
  Administered 2020-09-18: 22 mL

## 2020-09-18 MED ORDER — ACETAMINOPHEN 500 MG PO TABS
1000.0000 mg | ORAL_TABLET | ORAL | Status: AC
  Administered 2020-09-18: 1000 mg via ORAL

## 2020-09-18 MED ORDER — SODIUM CHLORIDE 0.9% FLUSH: 3.0000 mL | INTRAVENOUS | Status: DC | PRN

## 2020-09-18 MED ORDER — HYDROMORPHONE HCL 1 MG/ML IJ SOLN
0.2500 mg | INTRAMUSCULAR | Status: DC | PRN
  Administered 2020-09-18: 0.25 mg via INTRAVENOUS

## 2020-09-18 MED ORDER — ACETAMINOPHEN 500 MG PO TABS
ORAL_TABLET | ORAL | Status: AC
  Filled 2020-09-18: qty 2

## 2020-09-18 MED ORDER — OXYCODONE HCL 5 MG/5ML PO SOLN: 5.0000 mg | Freq: Once | ORAL | Status: AC | PRN

## 2020-09-18 MED ORDER — MIDAZOLAM HCL 2 MG/2ML IJ SOLN
INTRAMUSCULAR | Status: AC
  Filled 2020-09-18: qty 2

## 2020-09-18 MED ORDER — CEFAZOLIN SODIUM-DEXTROSE 2-4 GM/100ML-% IV SOLN
2.0000 g | INTRAVENOUS | Status: AC
  Administered 2020-09-18: 2 g via INTRAVENOUS

## 2020-09-18 MED ORDER — MEPERIDINE HCL 25 MG/ML IJ SOLN: 6.2500 mg | INTRAMUSCULAR | Status: DC | PRN

## 2020-09-18 MED ORDER — BUPIVACAINE LIPOSOME 1.3 % IJ SUSP
INTRAMUSCULAR | Status: DC | PRN
  Administered 2020-09-18: 15 mL

## 2020-09-18 MED ORDER — DOCUSATE SODIUM 100 MG PO CAPS: 100.0000 mg | ORAL_CAPSULE | Freq: Two times a day (BID) | ORAL | 0 refills | Status: AC

## 2020-09-18 MED ORDER — CEFAZOLIN SODIUM-DEXTROSE 2-4 GM/100ML-% IV SOLN
INTRAVENOUS | Status: AC
  Filled 2020-09-18: qty 100

## 2020-09-18 MED ORDER — OXYCODONE HCL 5 MG PO TABS
ORAL_TABLET | ORAL | Status: AC
  Filled 2020-09-18: qty 1

## 2020-09-18 MED ORDER — OXYCODONE HCL 5 MG PO TABS
5.0000 mg | ORAL_TABLET | Freq: Once | ORAL | Status: AC | PRN
  Administered 2020-09-18: 5 mg via ORAL

## 2020-09-18 MED ORDER — ONDANSETRON HCL 4 MG/2ML IJ SOLN
INTRAMUSCULAR | Status: DC | PRN
  Administered 2020-09-18: 4 mg via INTRAVENOUS

## 2020-09-18 MED ORDER — LACTATED RINGERS IV SOLN: INTRAVENOUS | Status: DC

## 2020-09-18 MED ORDER — FENTANYL CITRATE (PF) 100 MCG/2ML IJ SOLN
INTRAMUSCULAR | Status: AC
  Filled 2020-09-18: qty 2

## 2020-09-18 MED ORDER — OXYCODONE HCL 5 MG PO TABS: 5.0000 mg | ORAL_TABLET | ORAL | Status: DC | PRN

## 2020-09-18 MED ORDER — ACETAMINOPHEN 325 MG PO TABS: 650.0000 mg | ORAL_TABLET | ORAL | Status: DC | PRN

## 2020-09-18 MED ORDER — OXYCODONE HCL 5 MG PO TABS
5.0000 mg | ORAL_TABLET | Freq: Three times a day (TID) | ORAL | 0 refills | Status: AC | PRN
Start: 2020-09-18 — End: 2021-09-18

## 2020-09-18 MED ORDER — SODIUM CHLORIDE 0.9% FLUSH: 3.0000 mL | Freq: Two times a day (BID) | INTRAVENOUS | Status: DC

## 2020-09-18 MED ORDER — LIDOCAINE 2% (20 MG/ML) 5 ML SYRINGE
INTRAMUSCULAR | Status: AC
  Filled 2020-09-18: qty 5

## 2020-09-18 MED ORDER — LIDOCAINE 2% (20 MG/ML) 5 ML SYRINGE
INTRAMUSCULAR | Status: DC | PRN
  Administered 2020-09-18: 40 mg via INTRAVENOUS

## 2020-09-18 MED ORDER — ONDANSETRON HCL 4 MG/2ML IJ SOLN
INTRAMUSCULAR | Status: AC
  Filled 2020-09-18: qty 2

## 2020-09-18 MED ORDER — GABAPENTIN 300 MG PO CAPS: 300.0000 mg | ORAL_CAPSULE | ORAL | Status: DC

## 2020-09-18 MED ORDER — 0.9 % SODIUM CHLORIDE (POUR BTL) OPTIME
TOPICAL | Status: DC | PRN
  Administered 2020-09-18: 500 mL

## 2020-09-18 MED ORDER — AMISULPRIDE (ANTIEMETIC) 5 MG/2ML IV SOLN: 10.0000 mg | Freq: Once | INTRAVENOUS | Status: DC | PRN

## 2020-09-18 MED ORDER — FENTANYL CITRATE (PF) 100 MCG/2ML IJ SOLN
25.0000 ug | INTRAMUSCULAR | Status: DC | PRN
  Administered 2020-09-18: 50 ug via INTRAVENOUS

## 2020-09-18 MED ORDER — ROCURONIUM BROMIDE 10 MG/ML (PF) SYRINGE
PREFILLED_SYRINGE | INTRAVENOUS | Status: DC | PRN
  Administered 2020-09-18: 50 mg via INTRAVENOUS

## 2020-09-18 MED ORDER — DEXAMETHASONE SODIUM PHOSPHATE 10 MG/ML IJ SOLN
INTRAMUSCULAR | Status: AC
  Filled 2020-09-18: qty 1

## 2020-09-18 MED ORDER — DEXAMETHASONE SODIUM PHOSPHATE 10 MG/ML IJ SOLN
INTRAMUSCULAR | Status: DC | PRN
  Administered 2020-09-18: 10 mg via INTRAVENOUS

## 2020-09-18 MED ORDER — ROCURONIUM BROMIDE 10 MG/ML (PF) SYRINGE
PREFILLED_SYRINGE | INTRAVENOUS | Status: AC
  Filled 2020-09-18: qty 10

## 2020-09-18 MED ORDER — WHITE PETROLATUM EX OINT
TOPICAL_OINTMENT | CUTANEOUS | Status: AC
  Filled 2020-09-18: qty 5

## 2020-09-18 SURGICAL SUPPLY — 46 items
ADH SKN CLS APL DERMABOND .7 (GAUZE/BANDAGES/DRESSINGS) ×1
APL PRP STRL LF DISP 70% ISPRP (MISCELLANEOUS) ×1
APL SKNCLS STERI-STRIP NONHPOA (GAUZE/BANDAGES/DRESSINGS) ×1
BALL CTTN LRG ABS STRL LF (GAUZE/BANDAGES/DRESSINGS) ×1
BENZOIN TINCTURE PRP APPL 2/3 (GAUZE/BANDAGES/DRESSINGS) ×2 IMPLANT
BLADE CLIPPER SENSICLIP SURGIC (BLADE) IMPLANT
BLADE HEX COATED 2.75 (ELECTRODE) ×2 IMPLANT
BLADE SURG 15 STRL LF DISP TIS (BLADE) ×1 IMPLANT
BLADE SURG 15 STRL SS (BLADE) ×2
CHLORAPREP W/TINT 26 (MISCELLANEOUS) ×2 IMPLANT
COTTONBALL LRG STERILE PKG (GAUZE/BANDAGES/DRESSINGS) ×2 IMPLANT
COVER BACK TABLE 60X90IN (DRAPES) ×2 IMPLANT
COVER MAYO STAND STRL (DRAPES) ×2 IMPLANT
COVER WAND RF STERILE (DRAPES) ×2 IMPLANT
DECANTER SPIKE VIAL GLASS SM (MISCELLANEOUS) IMPLANT
DERMABOND ADVANCED (GAUZE/BANDAGES/DRESSINGS) ×1
DERMABOND ADVANCED .7 DNX12 (GAUZE/BANDAGES/DRESSINGS) ×1 IMPLANT
DRAPE LAPAROTOMY 100X72 PEDS (DRAPES) ×2 IMPLANT
DRAPE UTILITY XL STRL (DRAPES) ×2 IMPLANT
DRSG TEGADERM 4X4.75 (GAUZE/BANDAGES/DRESSINGS) IMPLANT
ELECT REM PT RETURN 9FT ADLT (ELECTROSURGICAL) ×2
ELECTRODE REM PT RTRN 9FT ADLT (ELECTROSURGICAL) ×1 IMPLANT
GLOVE BIO SURGEON STRL SZ 6 (GLOVE) ×2 IMPLANT
GLOVE BIOGEL PI IND STRL 6.5 (GLOVE) ×1 IMPLANT
GLOVE BIOGEL PI INDICATOR 6.5 (GLOVE) ×1
GOWN STRL REUS W/TWL LRG LVL3 (GOWN DISPOSABLE) ×2 IMPLANT
NEEDLE HYPO 22GX1.5 SAFETY (NEEDLE) ×2 IMPLANT
NS IRRIG 500ML POUR BTL (IV SOLUTION) IMPLANT
PACK BASIN DAY SURGERY FS (CUSTOM PROCEDURE TRAY) ×2 IMPLANT
PENCIL SMOKE EVACUATOR (MISCELLANEOUS) ×2 IMPLANT
SLEEVE SCD COMPRESS KNEE MED (MISCELLANEOUS) ×2 IMPLANT
STRIP CLOSURE SKIN 1/2X4 (GAUZE/BANDAGES/DRESSINGS) ×2 IMPLANT
SUT ETHIBOND 0 MO6 C/R (SUTURE) ×2 IMPLANT
SUT MNCRL AB 4-0 PS2 18 (SUTURE) ×2 IMPLANT
SUT VIC AB 0 SH 27 (SUTURE) IMPLANT
SUT VIC AB 2-0 SH 27 (SUTURE)
SUT VIC AB 2-0 SH 27XBRD (SUTURE) IMPLANT
SUT VIC AB 3-0 SH 27 (SUTURE) ×2
SUT VIC AB 3-0 SH 27X BRD (SUTURE) ×1 IMPLANT
SUT VICRYL 3 0 BR 18  UND (SUTURE)
SUT VICRYL 3 0 BR 18 UND (SUTURE) IMPLANT
SYR BULB IRRIG 60ML STRL (SYRINGE) IMPLANT
SYR CONTROL 10ML LL (SYRINGE) ×2 IMPLANT
TOWEL OR 17X26 10 PK STRL BLUE (TOWEL DISPOSABLE) ×2 IMPLANT
TUBE CONNECTING 12X1/4 (SUCTIONS) ×2 IMPLANT
YANKAUER SUCT BULB TIP NO VENT (SUCTIONS) ×2 IMPLANT

## 2020-09-18 NOTE — Transfer of Care (Signed)
Immediate Anesthesia Transfer of Care Note  Patient: Shawn Potts  Procedure(s) Performed: Procedure(s) (LRB): OPEN UMBILICAL HERNIA REPAIR (N/A)  Patient Location: PACU  Anesthesia Type: General  Level of Consciousness: awake, oriented, sedated and patient cooperative  Airway & Oxygen Therapy: Patient Spontanous Breathing and Patient connected to face mask oxygen  Post-op Assessment: Report given to PACU RN and Post -op Vital signs reviewed and stable  Post vital signs: Reviewed and stable  Complications: No apparent anesthesia complications Last Vitals:  Vitals Value Taken Time  BP 175/90 09/18/20 1037  Temp 36.4 C 09/18/20 1037  Pulse 76 09/18/20 1040  Resp 16 09/18/20 1040  SpO2 98 % 09/18/20 1040  Vitals shown include unvalidated device data.  Last Pain:  Vitals:   09/18/20 0905  TempSrc: Oral  PainSc: 2       Patients Stated Pain Goal: 5 (09/18/20 0905)  Complications: No complications documented.

## 2020-09-18 NOTE — Anesthesia Preprocedure Evaluation (Addendum)
Anesthesia Evaluation  Patient identified by MRN, date of birth, ID band Patient awake    Reviewed: Allergy & Precautions, NPO status , Patient's Chart, lab work & pertinent test results  Airway Mallampati: I  TM Distance: >3 FB Neck ROM: Full    Dental  (+) Poor Dentition, Chipped, Missing,    Pulmonary asthma , Current Smoker,    breath sounds clear to auscultation       Cardiovascular hypertension, Pt. on medications  Rhythm:Regular Rate:Normal     Neuro/Psych Seizures -, Well Controlled,  - over 8 months since last seizure  negative psych ROS   GI/Hepatic negative GI ROS, Neg liver ROS,   Endo/Other  negative endocrine ROS  Renal/GU negative Renal ROS     Musculoskeletal negative musculoskeletal ROS (+)   Abdominal (+) + obese,   Peds  Hematology negative hematology ROS (+)   Anesthesia Other Findings   Reproductive/Obstetrics                            Anesthesia Physical Anesthesia Plan  ASA: II  Anesthesia Plan: General   Post-op Pain Management:    Induction: Intravenous  PONV Risk Score and Plan: 2 and Ondansetron, Dexamethasone and Midazolam  Airway Management Planned: Oral ETT  Additional Equipment: None  Intra-op Plan:   Post-operative Plan: Extubation in OR  Informed Consent:   Plan Discussed with: CRNA  Anesthesia Plan Comments:         Anesthesia Quick Evaluation

## 2020-09-18 NOTE — Anesthesia Postprocedure Evaluation (Signed)
Anesthesia Post Note  Patient: Shawn Potts  Procedure(s) Performed: OPEN UMBILICAL HERNIA REPAIR (N/A Abdomen)     Patient location during evaluation: PACU Anesthesia Type: General Level of consciousness: awake and alert Pain management: pain level controlled Vital Signs Assessment: post-procedure vital signs reviewed and stable Respiratory status: spontaneous breathing, nonlabored ventilation, respiratory function stable and patient connected to nasal cannula oxygen Cardiovascular status: blood pressure returned to baseline and stable Postop Assessment: no apparent nausea or vomiting Anesthetic complications: no   No complications documented.  Last Vitals:  Vitals:   09/18/20 1115 09/18/20 1221  BP: (!) 152/86 (!) 164/92  Pulse: (!) 54 (!) 53  Resp: (!) 7 18  Temp: 36.4 C   SpO2: 98% 97%    Last Pain:  Vitals:   09/18/20 1221  TempSrc:   PainSc: 3                  Shelton Silvas

## 2020-09-18 NOTE — Anesthesia Procedure Notes (Signed)
Procedure Name: Intubation Date/Time: 09/18/2020 9:40 AM Performed by: Rogers Blocker, CRNA Pre-anesthesia Checklist: Patient identified, Emergency Drugs available, Suction available and Patient being monitored Patient Re-evaluated:Patient Re-evaluated prior to induction Oxygen Delivery Method: Circle System Utilized Preoxygenation: Pre-oxygenation with 100% oxygen Induction Type: IV induction Ventilation: Mask ventilation without difficulty Laryngoscope Size: Mac and 4 Grade View: Grade I Tube type: Oral Number of attempts: 2 Airway Equipment and Method: Stylet and Oral airway Placement Confirmation: ETT inserted through vocal cords under direct vision,  positive ETCO2 and breath sounds checked- equal and bilateral Secured at: 22 cm Tube secured with: Tape Dental Injury: Teeth and Oropharynx as per pre-operative assessment

## 2020-09-18 NOTE — Op Note (Signed)
Operative Note  Shawn Potts  [id number]  [id number]  09/18/2020   Surgeon: Leeroy Bock A ConnorMD  Procedure performed: Open repair of chronically incarcerated umbilical hernia  Preop diagnosis: Incarcerated umbilical hernia Post-op diagnosis/intraop findings: Same, fascial defect 1 cm  Specimens: No Retained items: No EBL: Minimal cc Complications: none  Description of procedure: After obtaining informed consent the patient was taken to the operating room and placed supine on operating room table wheregeneral endotracheal anesthesia was initiated, preoperative antibiotics were administered, SCDs applied, and a formal timeout was performed.  The abdomen was clipped, prepped and draped in usual sterile fashion.  After infiltration with local (Exparel mixed with quarter percent Marcaine and epinephrine) an infraumbilical incision was made and the soft tissue dissected with cautery until the hernia sac was encountered.  The umbilical skin was divided from the sac with cautery and the sac was skeletonized to the level of the fascia where it was entered and noted to contain preperitoneal fat.  The peritoneal cavity was not entered.  The fascia was cleared off and the defect measured approximately 1 cm.  This was closed transversely with interrupted 0 Ethibonds.  Additional local was infiltrated in the abdominal wall and subcutaneous tissues.  The umbilical skin was sutured to the fascia with a 3-0 Vicryl.  The skin was closed with running subcuticular 4-0 Monocryl.  Benzoin, Steri-Strips and a light pressure dressing of cotton balls and tape was applied.  The patient was then awakened, extubated and taken to PACU in stable condition.   All counts were correct at the completion of the case.

## 2020-09-18 NOTE — Discharge Instructions (Signed)
HERNIA REPAIR: POST OP INSTRUCTIONS   EAT Gradually transition to a high fiber diet with a fiber supplement over the next few weeks after discharge.  Start with a pureed / full liquid diet (see below)  WALK Walk an hour a day (cumulative- not all at once).  Control your pain to do that.    CONTROL PAIN Control pain so that you can walk, sleep, tolerate sneezing/coughing, and go up/down stairs.  HAVE A BOWEL MOVEMENT DAILY Keep your bowels regular to avoid problems.  OK to try a laxative to override constipation.  OK to use an antidairrheal to slow down diarrhea.  Call if not better after 2 tries  CALL IF YOU HAVE PROBLEMS/CONCERNS Call if you are still struggling despite following these instructions. Call if you have concerns not answered by these instructions  ######################################################################    1. DIET: Follow a light bland diet & liquids the first 24 hours after arrival home, such as soup, liquids, starches, etc.  Be sure to drink plenty of fluids.  Quickly advance to a usual solid diet within a few days.  Avoid fast food or heavy meals as your are more likely to get nauseated or have irregular bowels.  A low-sugar, high-fiber diet for the rest of your life is ideal.   2. Take your usually prescribed home medications unless otherwise directed.  3. PAIN CONTROL: a. Pain is best controlled by a usual combination of three different methods TOGETHER: i. Ice/Heat ii. Over the counter pain medication iii. Prescription pain medication b. Most patients will experience some swelling and bruising around the hernia(s) such as the bellybutton, groins, or old incisions.  Ice packs or heating pads (30-60 minutes up to 6 times a day) will help. Use ice for the first few days to help decrease swelling and bruising, then switch to heat to help relax tight/sore spots and speed recovery.  Some people prefer to use ice alone, heat alone, alternating between ice &  heat.  Experiment to what works for you.  Swelling and bruising can take several weeks to resolve.   c. It is helpful to take an over-the-counter pain medication regularly for the first days: i. Naproxen (Aleve, etc)  Two 266m tabs twice a day OR Ibuprofen (Advil, etc) Three 2010mtabs four times a day (every meal & bedtime) AND ii. Acetaminophen (Tylenol, etc) 325-65057mour times a day (every meal & bedtime) d. A  prescription for pain medication should be given to you upon discharge.  Take your pain medication as prescribed, IF NEEDED.  i. If you are having problems/concerns with the prescription medicine (does not control pain, nausea, vomiting, rash, itching, etc), please call us [phone] see if we need to switch you to a different pain medicine that will work better for you and/or control your side effect better. ii. If you need a refill on your pain medication, please contact your pharmacy.  They will contact our office to request authorization. Prescriptions will not be filled after 5 pm or on week-ends.  4. Avoid getting constipated.  Between the surgery and the pain medications, it is common to experience some constipation.  Increasing fluid intake and taking a fiber supplement (such as Metamucil, Citrucel, FiberCon, MiraLax, etc) 1-2 times a day regularly will usually help prevent this problem from occurring.  A mild laxative (prune juice, Milk of Magnesia, MiraLax, etc) should be taken according to package directions if there are no bowel movements after 48 hours.    5.  Wash / shower every day, starting 2 days after surgery.  You may shower over the steri strips which are waterproof.    6. Remove your outer bandage 2 days after surgery. Steri strips will peel off after 1-2 weeks.  You may leave the incision open to air.  You may replace a dressing/Band-Aid to cover an incision for comfort if you wish.  Continue to shower over incision(s) after the dressing is off.  7. ACTIVITIES  as tolerated:   a. You may resume regular (light) daily activities beginning the next day--such as daily self-care, walking, climbing stairs--gradually increasing activities as tolerated.  Control your pain so that you can walk an hour a day.  If you can walk 30 minutes without difficulty, it is safe to try more intense activity such as jogging, treadmill, bicycling, low-impact aerobics, swimming, etc. b. Refrain from the most intensive and strenuous activity such as sit-ups, heavy lifting, contact sports, etc  Refrain from any heavy lifting or straining until 6 weeks after surgery.   c. DO NOT PUSH THROUGH PAIN.  Let pain be your guide: If it hurts to do something, don't do it.  Pain is your body warning you to avoid that activity for another week until the pain goes down. d. You may drive when you are no longer taking prescription pain medication, you can comfortably wear a seatbelt, and you can safely maneuver your car and apply brakes. e. Bonita Quin may have sexual intercourse when it is comfortable.   8. FOLLOW UP in our office a. Please call [facility] at [phone] to set up an appointment to see your surgeon in the office for a follow-up appointment approximately 2-3 weeks after your surgery. b. Make sure that you call for this appointment the day you arrive home to insure a convenient appointment time.  9.  If you have disability of FMLA / Family leave forms, please bring the forms to the office for processing.  (do not give to your surgeon).  WHEN TO CALL us [phone]: 1. Poor pain control 2. Reactions / problems with new medications (rash/itching, nausea, etc)  3. Fever over 101.5 F (38.5 C) 4. Inability to urinate 5. Nausea and/or vomiting 6. Worsening swelling or bruising 7. Continued bleeding from incision. 8. Increased pain, redness, or drainage from the incision   The clinic staff is available to answer your questions during regular business hours (8:30am-5pm).  Please dont  hesitate to call and ask to speak to one of our nurses for clinical concerns.   If you have a medical emergency, go to the nearest emergency room or call 911.  A surgeon from [facility] is always on call at the hospitals in [facility], [place] [address], [place], [place]  [address] ?  [address] [address], [place], [place]   [address] MAIN: [phone] ? TOLL FREE: [phone] ? FAX: [phone] [email or web address]  Information for Discharge Teaching: EXPAREL (bupivacaine liposome injectable suspension)   Your surgeon or anesthesiologist gave you EXPAREL(bupivacaine) to help control your pain after surgery.   EXPAREL is a local anesthetic that provides pain relief by numbing the tissue around the surgical site.  EXPAREL is designed to release pain medication over time and can control pain for up to 72 hours.  Depending on how you respond to EXPAREL, you may require less pain medication during your recovery.  Possible side effects:  Temporary loss of sensation or ability to move in the area  where bupivacaine was injected.  Nausea, vomiting, constipation  Rarely, numbness and tingling in your mouth or lips, lightheadedness, or anxiety may occur.  Call your doctor right away if you think you may be experiencing any of these sensations, or if you have other questions regarding possible side effects.  Follow all other discharge instructions given to you by your surgeon or nurse. Eat a healthy diet and drink plenty of water or other fluids.  If you return to the hospital for any reason within 96 hours following the administration of EXPAREL, it is important for health care providers to know that you have received this anesthetic. A teal colored band has been placed on your arm with the date, time and amount of EXPAREL you have received in order to alert and inform your health care providers. Please leave this armband in place  for the full 96 hours following administration, and then you may remove the band.   Post Anesthesia Home Care Instructions  Activity: Get plenty of rest for the remainder of the day. A responsible individual must stay with you for 24 hours following the procedure.  For the next 24 hours, DO NOT: -Drive a car -Advertising copywriter -Drink alcoholic beverages -Take any medication unless instructed by your physician -Make any legal decisions or sign important papers.  Meals: Start with liquid foods such as gelatin or soup. Progress to regular foods as tolerated. Avoid greasy, spicy, heavy foods. If nausea and/or vomiting occur, drink only clear liquids until the nausea and/or vomiting subsides. Call your physician if vomiting continues.  Special Instructions/Symptoms: Your throat may feel dry or sore from the anesthesia or the breathing tube placed in your throat during surgery. If this causes discomfort, gargle with warm salt water. The discomfort should disappear within 24 hours.

## 2020-09-22 ENCOUNTER — Encounter (HOSPITAL_BASED_OUTPATIENT_CLINIC_OR_DEPARTMENT_OTHER): Payer: Self-pay | Admitting: Surgery

## 2020-12-21 ENCOUNTER — Other Ambulatory Visit: Payer: Self-pay | Admitting: *Deleted

## 2020-12-21 DIAGNOSIS — Z87891 Personal history of nicotine dependence: Secondary | ICD-10-CM

## 2020-12-21 DIAGNOSIS — F17210 Nicotine dependence, cigarettes, uncomplicated: Secondary | ICD-10-CM

## 2021-01-13 ENCOUNTER — Encounter: Payer: Medicare (Managed Care) | Admitting: Acute Care

## 2021-01-13 ENCOUNTER — Ambulatory Visit: Payer: Medicare (Managed Care)

## 2021-02-03 ENCOUNTER — Ambulatory Visit: Payer: Medicare (Managed Care)

## 2021-02-03 ENCOUNTER — Ambulatory Visit: Payer: Medicare (Managed Care) | Admitting: Acute Care

## 2021-02-03 DIAGNOSIS — Z122 Encounter for screening for malignant neoplasm of respiratory organs: Secondary | ICD-10-CM

## 2021-02-03 DIAGNOSIS — F17210 Nicotine dependence, cigarettes, uncomplicated: Secondary | ICD-10-CM

## 2021-02-03 NOTE — Progress Notes (Signed)
Shared Decision Making Visit Lung Cancer Screening Program [phone])   Eligibility:  Age 55 y.o.  Pack Years Smoking History Calculation 110 pack years  (# packs/per year x # years smoked)  Recent History of coughing up blood  no  Unexplained weight loss? no ( >Than 15 pounds within the last 6 months )  Prior History Lung / other cancer no (Diagnosis within the last 5 years already requiring surveillance chest CT Scans).  Smoking Status Current Smoker  Former Smokers: Years since quit: NA  Quit Date: NA  Visit Components:  Discussion included one or more decision making aids. yes  Discussion included risk/benefits of screening. yes  Discussion included potential follow up diagnostic testing for abnormal scans. yes  Discussion included meaning and risk of over diagnosis. yes  Discussion included meaning and risk of False Positives. yes  Discussion included meaning of total radiation exposure. yes  Counseling Included:  Importance of adherence to annual lung cancer LDCT screening. yes  Impact of comorbidities on ability to participate in the program. yes  Ability and willingness to under diagnostic treatment. yes  Smoking Cessation Counseling:  Current Smokers:   Discussed importance of smoking cessation. yes  Information about tobacco cessation classes and interventions provided to patient. yes  Patient provided with "ticket" for LDCT Scan. yes  Symptomatic Patient. no  Counseling NA  Diagnosis Code: Tobacco Use Z72.0  Asymptomatic Patient yes  Counseling (Intermediate counseling: > three minutes counseling) X7353  Former Smokers:   Discussed the importance of maintaining cigarette abstinence. yes  Diagnosis Code: Personal History of Nicotine Dependence. G99.242  Information about tobacco cessation classes and interventions provided to patient. Yes  Patient provided with "ticket" for LDCT Scan. yes  Written Order for Lung Cancer Screening with  LDCT placed in Epic. Yes (CT Chest Lung Cancer Screening Low Dose W/O CM) [id number] Z12.2-Screening of respiratory organs Z87.891-Personal history of nicotine dependence  I have spent 25 minutes of face to face time with Mr. Geter discussing the risks and benefits of lung cancer screening. We viewed a power point together that explained in detail the above noted topics. We paused at intervals to allow for questions to be asked and answered to ensure understanding.We discussed that the single most powerful action that he can take to decrease his risk of developing lung cancer is to quit smoking. We discussed whether or not he is ready to commit to setting a quit date. We discussed options for tools to aid in quitting smoking including nicotine replacement therapy, non-nicotine medications, support groups, Quit Smart classes, and behavior modification. We discussed that often times setting smaller, more achievable goals, such as eliminating 1 cigarette a day for a week and then 2 cigarettes a day for a week can be helpful in slowly decreasing the number of cigarettes smoked. This allows for a sense of accomplishment as well as providing a clinical benefit. I gave him the " Be Stronger Than Your Excuses" card with contact information for community resources, classes, free nicotine replacement therapy, and access to mobile apps, text messaging, and on-line smoking cessation help. I have also given him my card and contact information in the event he needs to contact me. We discussed the time and location of the scan, and that either Abigail Miyamoto RN or I will call with the results within 24-48 hours of receiving them. I have offered him  a copy of the power point we viewed  as a resource in the event they need reinforcement of  the concepts we discussed today in the office. The patient verbalized understanding of all of  the above and had no further questions upon leaving the office. They have my contact information  in the event they have any further questions.  I spent 3 minutes counseling on smoking cessation and the health risks of continued tobacco abuse.  I explained to the patient that there has been a high incidence of coronary artery disease noted on these exams. I explained that this is a non-gated exam therefore degree or severity cannot be determined. This patient is currently on statin therapy. I have asked the patient to follow-up with their PCP regarding any incidental finding of coronary artery disease and management with diet or medication as their PCP  feels is clinically indicated. The patient verbalized understanding of the above and had no further questions upon completion of the visit.       Bevelyn Ngo, NP 02/03/2021

## 2021-04-12 ENCOUNTER — Encounter: Payer: Self-pay | Admitting: Acute Care

## 2021-04-12 ENCOUNTER — Ambulatory Visit (INDEPENDENT_AMBULATORY_CARE_PROVIDER_SITE_OTHER): Payer: Medicare (Managed Care) | Admitting: Acute Care

## 2021-04-12 ENCOUNTER — Ambulatory Visit
Admission: RE | Admit: 2021-04-12 | Discharge: 2021-04-12 | Disposition: A | Discharge status: Home / Self Care | Payer: Medicare (Managed Care) | Source: Ambulatory Visit | Attending: Acute Care | Admitting: Acute Care

## 2021-04-12 ENCOUNTER — Other Ambulatory Visit: Payer: Self-pay

## 2021-04-12 VITALS — BP 122/82 | HR 53 | Temp 97.7°F | Ht 67.0 in | Wt 168.0 lb

## 2021-04-12 DIAGNOSIS — F17210 Nicotine dependence, cigarettes, uncomplicated: Secondary | ICD-10-CM

## 2021-04-12 DIAGNOSIS — Z87891 Personal history of nicotine dependence: Secondary | ICD-10-CM

## 2021-04-12 DIAGNOSIS — J449 Chronic obstructive pulmonary disease, unspecified: Secondary | ICD-10-CM

## 2021-04-12 DIAGNOSIS — Z720 Tobacco use: Secondary | ICD-10-CM

## 2021-04-12 NOTE — Progress Notes (Signed)
Please call patient and let them  know their  low dose Ct was read as a Lung RADS 2: nodules that are benign in appearance and behavior with a very low likelihood of becoming a clinically active cancer due to size or lack of growth. Recommendation per radiology is for a repeat LDCT in 12 months. .Please let them  know we will order and schedule their  annual screening scan for 03/2022 Please let them  know there was notation of CAD on their  scan.  Please remind the patient  that this is a non-gated exam therefore degree or severity of disease  cannot be determined. Please have them  follow up with their PCP regarding potential risk factor modification, dietary therapy or pharmacologic therapy if clinically indicated. Pt.  is is currently on statin therapy. Please place order for annual  screening scan for  03/2022 and fax results to PCP. Thanks so much.  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 They have not had an echo since 2013. Thanks so much

## 2021-04-12 NOTE — Progress Notes (Signed)
Shared Decision Making Visit Lung Cancer Screening Program [phone])   Eligibility:  Age 55 y.o.  Pack Years Smoking History Calculation 110 pack year smoking history (# packs/per year x # years smoked)  Recent History of coughing up blood  no  Unexplained weight loss? no ( >Than 15 pounds within the last 6 months )  Prior History Lung / other cancer no (Diagnosis within the last 5 years already requiring surveillance chest CT Scans).  Smoking Status Current Smoker  Former Smokers: Years since quit: NA  Quit Date: NA  Visit Components:  Discussion included one or more decision making aids. yes  Discussion included risk/benefits of screening. yes  Discussion included potential follow up diagnostic testing for abnormal scans. yes  Discussion included meaning and risk of over diagnosis. yes  Discussion included meaning and risk of False Positives. yes  Discussion included meaning of total radiation exposure. yes  Counseling Included:  Importance of adherence to annual lung cancer LDCT screening. yes  Impact of comorbidities on ability to participate in the program. yes  Ability and willingness to under diagnostic treatment. yes  Smoking Cessation Counseling:  Current Smokers:   Discussed importance of smoking cessation. yes  Information about tobacco cessation classes and interventions provided to patient. yes  Patient provided with "ticket" for LDCT Scan. yes  Symptomatic Patient. no  Counseling NA  Diagnosis Code: Tobacco Use Z72.0  Asymptomatic Patient yes  Counseling (Intermediate counseling: > three minutes counseling) U0454  Former Smokers:   Discussed the importance of maintaining cigarette abstinence. yes  Diagnosis Code: Personal History of Nicotine Dependence. U98.119  Information about tobacco cessation classes and interventions provided to patient. Yes  Patient provided with "ticket" for LDCT Scan. yes  Written Order for Lung Cancer  Screening with LDCT placed in Epic. Yes (CT Chest Lung Cancer Screening Low Dose W/O CM) [id number] Z12.2-Screening of respiratory organs Z87.891-Personal history of nicotine dependence  I have spent 25 minutes of face to face time with Mr. Minahan discussing the risks and benefits of lung cancer screening. We viewed a power point together that explained in detail the above noted topics. We paused at intervals to allow for questions to be asked and answered to ensure understanding.We discussed that the single most powerful action that he can take to decrease his risk of developing lung cancer is to quit smoking. We discussed whether or not he is ready to commit to setting a quit date. We discussed options for tools to aid in quitting smoking including nicotine replacement therapy, non-nicotine medications, support groups, Quit Smart classes, and behavior modification. We discussed that often times setting smaller, more achievable goals, such as eliminating 1 cigarette a day for a week and then 2 cigarettes a day for a week can be helpful in slowly decreasing the number of cigarettes smoked. This allows for a sense of accomplishment as well as providing a clinical benefit. I gave him the " Be Stronger Than Your Excuses" card with contact information for community resources, classes, free nicotine replacement therapy, and access to mobile apps, text messaging, and on-line smoking cessation help. I have also given him my card and contact information in the event he needs to contact me. We discussed the time and location of the scan, and that either Abigail Miyamoto RN or I will call with the results within 24-48 hours of receiving them. I have offered him  a copy of the power point we viewed  as a resource in the event they need reinforcement  of the concepts we discussed today in the office. The patient verbalized understanding of all of  the above and had no further questions upon leaving the office. They have my  contact information in the event they have any further questions.  I spent 3 minutes counseling on smoking cessation and the health risks of continued tobacco abuse.  I explained to the patient that there has been a high incidence of coronary artery disease noted on these exams. I explained that this is a non-gated exam therefore degree or severity cannot be determined. This patient is on statin therapy. I have asked the patient to follow-up with their PCP regarding any incidental finding of coronary artery disease and management with diet or medication as their PCP  feels is clinically indicated. The patient verbalized understanding of the above and had no further questions upon completion of the visit.   Will refer to pulmonary for worsening cough in a smoker with a 110 pack year smoking history at patient's request.    Bevelyn Ngo, NP 04/12/2021

## 2021-04-12 NOTE — Patient Instructions (Signed)

## 2021-04-14 ENCOUNTER — Other Ambulatory Visit: Payer: Self-pay | Admitting: *Deleted

## 2021-04-14 DIAGNOSIS — Z87891 Personal history of nicotine dependence: Secondary | ICD-10-CM

## 2021-04-14 DIAGNOSIS — F17210 Nicotine dependence, cigarettes, uncomplicated: Secondary | ICD-10-CM

## 2021-05-03 ENCOUNTER — Other Ambulatory Visit: Payer: Self-pay

## 2021-05-03 ENCOUNTER — Emergency Department (HOSPITAL_COMMUNITY)
Admission: EM | Admit: 2021-05-03 | Discharge: 2021-05-03 | Discharge status: Against Medical Advice | Payer: Medicare (Managed Care) | Attending: Emergency Medicine | Admitting: Emergency Medicine

## 2021-05-03 ENCOUNTER — Emergency Department (HOSPITAL_COMMUNITY): Payer: Medicare (Managed Care)

## 2021-05-03 ENCOUNTER — Encounter (HOSPITAL_COMMUNITY): Payer: Self-pay

## 2021-05-03 DIAGNOSIS — M25512 Pain in left shoulder: Secondary | ICD-10-CM | POA: Insufficient documentation

## 2021-05-03 DIAGNOSIS — Z5321 Procedure and treatment not carried out due to patient leaving prior to being seen by health care provider: Secondary | ICD-10-CM | POA: Diagnosis not present

## 2021-05-03 DIAGNOSIS — W228XXA Striking against or struck by other objects, initial encounter: Secondary | ICD-10-CM | POA: Insufficient documentation

## 2021-05-03 DIAGNOSIS — M898X1 Other specified disorders of bone, shoulder: Secondary | ICD-10-CM

## 2021-05-03 NOTE — ED Triage Notes (Signed)
Patient complains of ongoing left clavicle pain after hit with tree branch to same this past Thursday. Seen at [facility] and reported break with no displacement. Patient here for pain control

## 2021-05-03 NOTE — ED Notes (Signed)
Patient to nurses desk stating that he is leaving. States "I am in pain and can sit in pain at home." Patient ambulatory to lobby after attempt to educate patient on the importance of staying for treatment

## 2021-05-03 NOTE — ED Provider Notes (Signed)
Patient eloped prior to me seeing him.  Did not see patient.  Patient did have MSE done.   Farrel Gordon, PA-C 05/03/21 1709    Terrilee Files, MD 05/04/21 [phone]

## 2021-05-03 NOTE — ED Provider Notes (Signed)
Emergency Medicine Provider Triage Evaluation Note  Shawn Potts , a 55 y.o. male  was evaluated in triage.  Pt complains of left clavicle pain that started 4 days ago after a tree limb fell on him. He further reports sob and pleuritic pain that he has had since the injury. Was seen at [facility] at that time and had ct chest with showed clavicle fx.   Review of Systems  Positive: Left clavicle pain, sob Negative: fever  Physical Exam  BP (!) 188/137 (BP Location: Right Arm)   Pulse 65   Temp 98.7 F (37.1 C) (Oral)   Resp 17   SpO2 99%  Gen:   Awake, no distress   Resp:  Normal effort  MSK:   Moves extremities without difficulty  Other:  Lungs ctab  Medical Decision Making  Medically screening exam initiated at 2:27 PM.  Appropriate orders placed.  [name] was informed that the remainder of the evaluation will be completed by another provider, this initial triage assessment does not replace that evaluation, and the importance of remaining in the ED until their evaluation is complete.     Karrie Meres, PA-C 05/03/21 1432    Rozelle Logan, DO 05/06/21 1115

## 2021-05-19 ENCOUNTER — Institutional Professional Consult (permissible substitution): Payer: Medicare (Managed Care) | Admitting: Pulmonary Disease

## 2021-06-02 ENCOUNTER — Ambulatory Visit: Payer: Medicare (Managed Care) | Admitting: Orthopaedic Surgery

## 2021-06-21 ENCOUNTER — Ambulatory Visit (INDEPENDENT_AMBULATORY_CARE_PROVIDER_SITE_OTHER): Payer: Medicare (Managed Care) | Admitting: Pulmonary Disease

## 2021-06-21 ENCOUNTER — Encounter: Payer: Self-pay | Admitting: Pulmonary Disease

## 2021-06-21 ENCOUNTER — Other Ambulatory Visit: Payer: Self-pay

## 2021-06-21 VITALS — BP 140/82 | HR 59 | Temp 98.2°F | Ht 67.0 in | Wt 168.4 lb

## 2021-06-21 DIAGNOSIS — F17210 Nicotine dependence, cigarettes, uncomplicated: Secondary | ICD-10-CM | POA: Diagnosis not present

## 2021-06-21 DIAGNOSIS — J439 Emphysema, unspecified: Secondary | ICD-10-CM

## 2021-06-21 DIAGNOSIS — Z720 Tobacco use: Secondary | ICD-10-CM

## 2021-06-21 MED ORDER — ALBUTEROL SULFATE HFA 108 (90 BASE) MCG/ACT IN AERS: 2.0000 | INHALATION_SPRAY | RESPIRATORY_TRACT | 6 refills | Status: DC | PRN

## 2021-06-21 MED ORDER — ANORO ELLIPTA 62.5-25 MCG/INH IN AEPB: 1.0000 | INHALATION_SPRAY | Freq: Every day | RESPIRATORY_TRACT | 11 refills | Status: DC

## 2021-06-21 NOTE — Progress Notes (Signed)
Subjective:   PATIENT ID: Shawn Potts GENDER: male DOB: 05-03-1966, MRN: [id number]   HPI  Chief Complaint  Patient presents with   Consult    Referred by Maralyn Sago, NP , COPD, states cough, white phlegm    Reason for Visit: New consult for COPD  Ms. Shawn Potts is a 55 year old male active smoker with emphysema with epilepsy who presents as as a new consult.  He reports chronic daily cough with some sputum production worsens in heat. Not associated with activity. Sometimes his coughing can be severe enough to cause dizziness. Denies shortness of breath or wheezing. Works part time in [place] and no limitations in lifting, stopping and bending. Does not cardio. He was using albuterol frequently with some relief.  Social History: Active smoker. 1ppd. Since he was 9 years. Previously smoked 4ppd. 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  Past Medical History:  Diagnosis Date   Asthma    bronchial asthma,    High cholesterol    Hypertension    Seizures (HCC) last seizure 09-2019   Umbilical hernia      No family history on file. No respiratory family hx.  Social History   Occupational History   Not on file  Tobacco Use   Smoking status: Every Day    Packs/day: 0.50    Years: 40.00    Pack years: 20.00    Types: Cigarettes   Smokeless tobacco: Never  Vaping Use   Vaping Use: Never used  Substance and Sexual Activity   Alcohol use: No   Drug use: Yes    Types: Marijuana    Comment:  used last week  09-15-2020   Sexual activity: Not on file    Allergies  Allergen Reactions   Gabapentin     Pt states gabapentin has caused seizures   Levetiracetam Other (See Comments)    Pt states, "When I take Keppra it makes me feel like I am having a heart attack."    Levetiracetam Other (See Comments)   Nsaids Other (See Comments)    Pt  reports aspirin and ibuprofen induce seizures     Outpatient Medications Prior to Visit  Medication Sig Dispense Refill   albuterol (VENTOLIN HFA) 108 (90 Base) MCG/ACT inhaler      amLODipine (NORVASC) 10 MG tablet Take 10 mg by mouth daily.      atorvastatin (LIPITOR) 20 MG tablet Take 20 mg by mouth daily.  3   hydrochlorothiazide (HYDRODIURIL) 25 MG tablet Take 25 mg by mouth daily.     losartan (COZAAR) 25 MG tablet Take 25 mg by mouth daily.     metoprolol succinate (TOPROL-XL) 50 MG 24 hr tablet Take 50 mg by mouth daily. Take with or immediately following a meal.     oxyCODONE (ROXICODONE) 5 MG immediate release tablet Take 1 tablet (5 mg total) by mouth every 8 (eight) hours as needed (pain not relieved by tylenol or ibuprofen). Alternate tylenol and ibuprofen for the first few days. Take narcotic pain medication only if needed for severe/ breakthrough pain. 10 tablet 0   Oxycodone HCl 10 MG TABS Take 10 mg by mouth 5 (five) times daily.  0   rosuvastatin (CRESTOR) 40 MG tablet  (Patient not taking: Reported on 06/21/2021)     PRESCRIPTION MEDICATION 2 inhalers name unknown use as needed 2 puffs prn     No facility-administered  medications prior to visit.    Review of Systems  Constitutional:  Negative for chills, diaphoresis, fever, malaise/fatigue and weight loss.  HENT:  Negative for congestion, ear pain and sore throat.   Respiratory:  Positive for cough and sputum production. Negative for hemoptysis, shortness of breath and wheezing.   Cardiovascular:  Negative for chest pain, palpitations and leg swelling.  Gastrointestinal:  Negative for abdominal pain, heartburn and nausea.  Genitourinary:  Negative for frequency.  Musculoskeletal:  Negative for joint pain and myalgias.  Skin:  Negative for itching and rash.  Neurological:  Positive for dizziness. Negative for weakness and headaches.  Endo/Heme/Allergies:  Does not bruise/bleed easily.  Psychiatric/Behavioral:  Negative  for depression. The patient is not nervous/anxious.     Objective:   Vitals:   06/21/21 1142  BP: 140/82  Pulse: (!) 59  Temp: 98.2 F (36.8 C)  TempSrc: Oral  SpO2: 97%  Weight: 168 lb 6.4 oz (76.4 kg)  Height: 5\' 7"  (1.702 m)   SpO2: 97 % O2 Device: None (Room air)  Physical Exam: General: Well-appearing, no acute distress HENT: [place], AT Eyes: EOMI, no scleral icterus Respiratory: Clear to auscultation bilaterally.  No crackles, wheezing or rales Cardiovascular: RRR, -M/R/G, no JVD Extremities:-Edema,-tenderness Neuro: AAO x4, CNII-XII grossly intact Psych: Normal mood, normal affect  Data Reviewed:  Imaging: CT Chest Lung screen - Centrilobular and paraseptal emphysema. Tiny centrilobular GGO bilaterally with upper lobe predominance suggestive of respiratory bronchiolitis. Calcified granulomas bilaterally.  PFT: None of file  Labs: CBC    Component Value Date/Time   WBC 8.2 10/23/2018 1316   RBC 4.31 10/23/2018 1316   HGB 15.6 09/18/2020 0912   HCT 46.0 09/18/2020 0912   PLT 360 10/23/2018 1316   MCV 91.9 10/23/2018 1316   MCH 29.7 10/23/2018 1316   MCHC 32.3 10/23/2018 1316   RDW 13.1 10/23/2018 1316   LYMPHSABS 2.0 11/03/2015 0842   MONOABS 0.4 11/03/2015 0842   EOSABS 0.1 11/03/2015 0842   BASOSABS 0.0 11/03/2015 0842   Absolute eos 11/03/15 - 100     Assessment & Plan:   Discussion: 55 year old active smoker with emphysema with chronic bronchitis. Not controlled on SABA. Discussed clinical progression and medical management of COPD. We discussed bronchodilator medications, compliance, evaluation and exacerbation management   Emphysema --START Anoro ONE puff ONCE a day --CONTINUE Albuterol as needed for shortness of breath and wheezing --Arrange for pulmonary function tests prior to next visit  Tobacco abuse Patient is an active smoker. Not a candidate for wellbutrin due to epilepsy We discussed smoking cessation for 10 minutes. We discussed  triggers and stressors and ways to deal with them. We discussed barriers to continued smoking and benefits of smoking cessation. Provided patient with information cessation techniques and interventions including  quitline.   Health Maintenance Immunization History  Administered Date(s) Administered   Moderna Sars-Covid-2 Vaccination 04/10/2020, 05/08/2020, 11/16/2020   Pneumococcal Polysaccharide-23 04/09/2018   Td 02/14/1967   Tdap 12/28/2011, 08/07/2013   CT Lung Screen -discuss at next visit  No orders of the defined types were placed in this encounter. Meds ordered this encounter  Medications   umeclidinium-vilanterol (ANORO ELLIPTA) 62.5-25 MCG/INH AEPB    Sig: Inhale 1 puff into the lungs daily.    Dispense:  60 each    Refill:  11   albuterol (VENTOLIN HFA) 108 (90 Base) MCG/ACT inhaler    Sig: Inhale 2 puffs into the lungs every 4 (four) hours as needed for wheezing or  shortness of breath.    Dispense:  6.7 g    Refill:  6   umeclidinium-vilanterol (ANORO ELLIPTA) 62.5-25 MCG/INH AEPB    Sig: Inhale 1 puff into the lungs daily.    Dispense:  14 each    Refill:  0    Order Specific Question:   Lot Number?    Answer:   [id number]    Order Specific Question:   Manufacturer?    Answer:   [employer] [[id number]]    Order Specific Question:   Quantity    Answer:   2    Return in about 2 months (around 08/21/2021).  I have spent a total time of 45-minutes on the day of the appointment reviewing prior documentation, coordinating care and discussing medical diagnosis and plan with the patient/family. Imaging, labs and tests included in this note have been reviewed and interpreted independently by me.  Nai Dasch Mechele Collin, MD [facility] 06/21/2021 11:55 AM  Office Number [phone]

## 2021-06-21 NOTE — Patient Instructions (Addendum)
Emphysema --START Anoro ONE puff ONCE a day --CONTINUE Albuterol as needed for shortness of breath and wheezing --Arrange for pulmonary function tests prior to next visit  Follow-up with me in 2 months

## 2021-06-22 MED ORDER — ANORO ELLIPTA 62.5-25 MCG/INH IN AEPB: 1.0000 | INHALATION_SPRAY | Freq: Every day | RESPIRATORY_TRACT | 0 refills | Status: DC

## 2021-06-29 ENCOUNTER — Other Ambulatory Visit: Payer: Self-pay | Admitting: *Deleted

## 2021-06-29 DIAGNOSIS — J439 Emphysema, unspecified: Secondary | ICD-10-CM

## 2021-07-08 ENCOUNTER — Ambulatory Visit (INDEPENDENT_AMBULATORY_CARE_PROVIDER_SITE_OTHER): Payer: Medicare (Managed Care) | Admitting: Orthopaedic Surgery

## 2021-07-08 ENCOUNTER — Ambulatory Visit (INDEPENDENT_AMBULATORY_CARE_PROVIDER_SITE_OTHER): Payer: Medicare (Managed Care)

## 2021-07-08 ENCOUNTER — Ambulatory Visit: Payer: Self-pay

## 2021-07-08 ENCOUNTER — Other Ambulatory Visit: Payer: Self-pay

## 2021-07-08 DIAGNOSIS — M25512 Pain in left shoulder: Secondary | ICD-10-CM | POA: Diagnosis not present

## 2021-07-08 DIAGNOSIS — G8929 Other chronic pain: Secondary | ICD-10-CM

## 2021-07-08 NOTE — Progress Notes (Signed)
Office Visit Note   Patient: Shawn Potts           Date of Birth: 1966-06-29           MRN: [id number] Visit Date: 07/08/2021              Requested by: Shawn James, MD [phone] Shawn Potts [address],  [place] [address] PCP: Shawn James, MD   Assessment & Plan: Visit Diagnoses:  1. Chronic left shoulder pain     Plan: Impression is chronic left shoulder pain with recent injury.  In terms of the medial clavicle fracture this has healed well and he is not symptomatic from this anymore but I am concerned that he may have underlying structural problem in his shoulder due to the injury from the wood.  We will order an MRI to assess for this and have him follow-up after the MRI.  Follow-Up Instructions: Return for After MRI.   Orders:  Orders Placed This Encounter  Procedures   XR Clavicle Left   No orders of the defined types were placed in this encounter.     Procedures: No procedures performed   Clinical Data: No additional findings.   Subjective: Chief Complaint  Patient presents with   Left Shoulder - Pain, Injury    Shawn Potts is a very pleasant 55 year old gentleman right-hand-dominant who comes in for follow-up of the left clavicle and left shoulder injury that he suffered on 04/29/2021.  He had 1000 pound piece of wood fall onto his left shoulder about 2 months ago and he suffered a medial left clavicle fracture.  This was treated nonoperatively and has gone on to heal.  However he has had continued left shoulder pain with use and range of motion.  Denies any numbness and tingling.   Review of Systems  Constitutional: Negative.   All other systems reviewed and are negative.   Objective: Vital Signs: There were no vitals taken for this visit.  Physical Exam Vitals and nursing note reviewed.  Constitutional:      Appearance: He is well-developed.  HENT:     Head: Normocephalic and atraumatic.  Eyes:     Pupils: Pupils are equal, round, and  reactive to light.  Pulmonary:     Effort: Pulmonary effort is normal.  Abdominal:     Palpations: Abdomen is soft.  Musculoskeletal:        General: Normal range of motion.     Cervical back: Neck supple.  Skin:    General: Skin is warm.  Neurological:     Mental Status: He is alert and oriented to person, place, and time.  Psychiatric:        Behavior: Behavior normal.        Thought Content: Thought content normal.        Judgment: Judgment normal.    Ortho Exam Left shoulder shows moderate to severe pain with attempted range of motion.  No neurovascular compromise.  Increased pain and weakness with manual muscle testing of the supraspinatus, infraspinatus, subscapularis.  The medial clavicle is slightly enlarged but there is no significant pain or crepitus with palpation.  Exam is limited by pain and guarding.  Specialty Comments:  No specialty comments available.  Imaging: XR Clavicle Left  Result Date: 07/08/2021 Healed medial clavicle fracture.  Degenerative changes at the [facility] joint.  No acute abnormalities.    PMFS History: Patient Active Problem List   Diagnosis Date Noted   Chest pain 02/06/2012  HTN (hypertension) 02/06/2012   Seizure disorder (HCC) 02/06/2012   Tobacco abuse 02/06/2012   Past Medical History:  Diagnosis Date   Asthma    bronchial asthma,    High cholesterol    Hypertension    Seizures (HCC) last seizure 09-2019   Umbilical hernia     No family history on file.  Past Surgical History:  Procedure Laterality Date   BRAIN SURGERY  x 4, lasy 2005 or 2006   BRAIN SURGERY     SHOULDER SURGERY Right yrs ago   SPINAL FIXATION SURGERY  yrs ago   cervical fusion   surgery on head  age 6   fell thru glass window, neck seriously injured   UMBILICAL HERNIA REPAIR N/A 09/18/2020   Procedure: OPEN UMBILICAL HERNIA REPAIR;  Surgeon: Berna Bue, MD;  Location: [facility];  Service: General;  Laterality: N/A;   Social  History   Occupational History   Not on file  Tobacco Use   Smoking status: Every Day    Packs/day: 0.50    Years: 40.00    Pack years: 20.00    Types: Cigarettes   Smokeless tobacco: Never  Vaping Use   Vaping Use: Never used  Substance and Sexual Activity   Alcohol use: No   Drug use: Yes    Types: Marijuana    Comment:  used last week  09-15-2020   Sexual activity: Not on file

## 2021-07-08 NOTE — Addendum Note (Signed)
Addended by: Wendi Maya on: 07/08/2021 08:58 AM   Modules accepted: Orders

## 2021-07-13 ENCOUNTER — Telehealth: Payer: Self-pay | Admitting: Orthopaedic Surgery

## 2021-07-13 NOTE — Telephone Encounter (Signed)
Pt wanted to make sure his orders for an Mri have been sent to [facility]. He has not gotten a call from them yet and wanted to verify they were sent before he calls them to make an appt. The best call back number for him is [phone].

## 2021-07-22 ENCOUNTER — Encounter: Payer: Self-pay | Admitting: Pulmonary Disease

## 2021-07-22 DIAGNOSIS — J439 Emphysema, unspecified: Secondary | ICD-10-CM | POA: Insufficient documentation

## 2021-07-24 ENCOUNTER — Other Ambulatory Visit: Payer: Medicare (Managed Care)

## 2021-07-26 ENCOUNTER — Telehealth: Payer: Self-pay | Admitting: Orthopaedic Surgery

## 2021-07-26 NOTE — Telephone Encounter (Signed)
Imaging calling asking for a call back. Can not find authorization for orders on pt and wanted a follow up so they can get pt sch'd. The best call back number is [phone].

## 2021-07-26 NOTE — Telephone Encounter (Signed)
Just now took care of this, pt has been approved.

## 2021-08-03 ENCOUNTER — Ambulatory Visit: Payer: Medicare (Managed Care) | Admitting: Orthopaedic Surgery

## 2021-08-06 ENCOUNTER — Ambulatory Visit: Payer: Medicare (Managed Care) | Admitting: Orthopaedic Surgery

## 2021-08-10 ENCOUNTER — Ambulatory Visit (INDEPENDENT_AMBULATORY_CARE_PROVIDER_SITE_OTHER): Payer: Medicare (Managed Care) | Admitting: Orthopaedic Surgery

## 2021-08-10 ENCOUNTER — Other Ambulatory Visit: Payer: Self-pay

## 2021-08-10 ENCOUNTER — Encounter: Payer: Self-pay | Admitting: Orthopaedic Surgery

## 2021-08-10 DIAGNOSIS — S46812A Strain of other muscles, fascia and tendons at shoulder and upper arm level, left arm, initial encounter: Secondary | ICD-10-CM | POA: Insufficient documentation

## 2021-08-10 DIAGNOSIS — M19012 Primary osteoarthritis, left shoulder: Secondary | ICD-10-CM | POA: Diagnosis not present

## 2021-08-10 DIAGNOSIS — M7542 Impingement syndrome of left shoulder: Secondary | ICD-10-CM

## 2021-08-10 NOTE — Progress Notes (Signed)
Office Visit Note   Patient: Shawn Potts           Date of Birth: 11/01/1966           MRN: [id number] Visit Date: 08/10/2021              Requested by: Deatra James, MD [phone] Shawn Potts [address],  [place] [address] PCP: Deatra James, MD   Assessment & Plan: Visit Diagnoses:  1. Impingement syndrome of left shoulder   2. Traumatic tear of supraspinatus tendon of left shoulder, initial encounter   3. Arthrosis of left acromioclavicular joint     Plan: MRI of the left shoulder from [facility] shows healed scapular spine fracture that is likely from the previous trauma earlier in the summer.  He does have AC joint arthrosis which is severe and a high-grade partial tear of the distal supraspinatus tendon.  These findings were reviewed with the patient in detail and treatment options were again discussed to include cortisone injection and continued physical therapy versus arthroscopic debridement and repair as indicated.  Based on his options he is elected to move forward with arthroscopic surgery.  He has done extensive physical therapy since the original injury and he is not interested in a cortisone injection as this is only temporary and for symptomatic relief.  Risk benefits rehab recovery reviewed with the patient in detail.  Questions encouraged and answered.  He requests to have the surgery sometime after Thanksgiving.  Follow-Up Instructions: No follow-ups on file.   Orders:  No orders of the defined types were placed in this encounter.  No orders of the defined types were placed in this encounter.     Procedures: No procedures performed   Clinical Data: No additional findings.   Subjective: Chief Complaint  Patient presents with   Left Shoulder - Pain    Shawn Potts is here to discuss MRI of the left shoulder that was recently completed at [facility].  He continues to have pain and popping in his shoulder with elevation and lowering of the  arm.   Review of Systems  Constitutional: Negative.   All other systems reviewed and are negative.   Objective: Vital Signs: There were no vitals taken for this visit.  Physical Exam Vitals and nursing note reviewed.  Constitutional:      Appearance: He is well-developed.  Pulmonary:     Effort: Pulmonary effort is normal.  Abdominal:     Palpations: Abdomen is soft.  Skin:    General: Skin is warm.  Neurological:     Mental Status: He is alert and oriented to person, place, and time.  Psychiatric:        Behavior: Behavior normal.        Thought Content: Thought content normal.        Judgment: Judgment normal.    Ortho Exam  Left shoulder examination shows positive Hawkins sign, positive Neer sign.  Positive empty can with mild weakness secondary to guarding.  Positive cross body adduction sign.  Specialty Comments:  No specialty comments available.  Imaging: No results found.   PMFS History: Patient Active Problem List   Diagnosis Date Noted   Traumatic tear of supraspinatus tendon of left shoulder 08/10/2021   Impingement syndrome of left shoulder 08/10/2021   Arthrosis of left acromioclavicular joint 08/10/2021   Pulmonary emphysema (HCC) 07/22/2021   Chest pain 02/06/2012   HTN (hypertension) 02/06/2012   Seizure disorder (HCC) 02/06/2012   Tobacco abuse  02/06/2012   Past Medical History:  Diagnosis Date   Asthma    bronchial asthma,    High cholesterol    Hypertension    Seizures (HCC) last seizure 09-2019   Umbilical hernia     History reviewed. No pertinent family history.  Past Surgical History:  Procedure Laterality Date   BRAIN SURGERY  x 4, lasy 2005 or 2006   BRAIN SURGERY     SHOULDER SURGERY Right yrs ago   SPINAL FIXATION SURGERY  yrs ago   cervical fusion   surgery on head  age 54   fell thru glass window, neck seriously injured   UMBILICAL HERNIA REPAIR N/A 09/18/2020   Procedure: OPEN UMBILICAL HERNIA REPAIR;  Surgeon:  Berna Bue, MD;  Location: [facility];  Service: General;  Laterality: N/A;   Social History   Occupational History   Not on file  Tobacco Use   Smoking status: Every Day    Packs/day: 0.50    Years: 40.00    Pack years: 20.00    Types: Cigarettes   Smokeless tobacco: Never  Vaping Use   Vaping Use: Never used  Substance and Sexual Activity   Alcohol use: No   Drug use: Yes    Types: Marijuana    Comment:  used last week  09-15-2020   Sexual activity: Not on file

## 2021-09-01 ENCOUNTER — Encounter: Payer: Self-pay | Admitting: Pulmonary Disease

## 2021-09-01 ENCOUNTER — Ambulatory Visit (INDEPENDENT_AMBULATORY_CARE_PROVIDER_SITE_OTHER): Payer: Medicare (Managed Care) | Admitting: Pulmonary Disease

## 2021-09-01 ENCOUNTER — Other Ambulatory Visit: Payer: Self-pay

## 2021-09-01 VITALS — BP 134/82 | HR 53 | Temp 97.5°F | Ht 67.0 in | Wt 173.0 lb

## 2021-09-01 DIAGNOSIS — F17200 Nicotine dependence, unspecified, uncomplicated: Secondary | ICD-10-CM

## 2021-09-01 DIAGNOSIS — J439 Emphysema, unspecified: Secondary | ICD-10-CM | POA: Diagnosis not present

## 2021-09-01 DIAGNOSIS — Z720 Tobacco use: Secondary | ICD-10-CM

## 2021-09-01 DIAGNOSIS — F17210 Nicotine dependence, cigarettes, uncomplicated: Secondary | ICD-10-CM | POA: Diagnosis not present

## 2021-09-01 MED ORDER — ALBUTEROL SULFATE HFA 108 (90 BASE) MCG/ACT IN AERS: 2.0000 | INHALATION_SPRAY | RESPIRATORY_TRACT | 6 refills | Status: AC | PRN

## 2021-09-01 MED ORDER — TRELEGY ELLIPTA 200-62.5-25 MCG/ACT IN AEPB: 1.0000 | INHALATION_SPRAY | Freq: Every day | RESPIRATORY_TRACT | 3 refills | Status: AC

## 2021-09-01 NOTE — Progress Notes (Signed)
Subjective:   PATIENT ID: Shawn Potts GENDER: male DOB: [birth year], MRN: [id number]   HPI  Chief Complaint  Patient presents with   Follow-up    Coughing has worsened after taking anoro    Reason for Visit: Follow-up  Ms. Shawn Potts is a 55 year old male active smoker with emphysema with epilepsy who presents for follow-up.  He reports chronic daily cough with some sputum production worsens in heat. Not associated with activity. Sometimes his coughing can be severe enough to cause dizziness. Denies shortness of breath or wheezing. Works part time in [place] and no limitations in lifting, stopping and bending. Does not cardio. He was using albuterol frequently with some relief.  09/01/21 He has been compliant with his Anoro and reports shortness of breath and wheezing overall improved. Cough is unchanged. Symptoms are improved in cool temperatures however will worsen with increased heat and humidity. Will use his his albuterol once a day. Denies exacerbations since our last visit. He continues smoke 1ppd. Has tried to call quit line and feels it is all talking.   Social History: Active smoker. 1ppd. Since he was 9 years. Previously smoked 4ppd. 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  Past Medical History:  Diagnosis Date   Asthma    bronchial asthma,    High cholesterol    Hypertension    Seizures (HCC) last seizure 09-2019   Umbilical hernia     Allergies  Allergen Reactions   Gabapentin     Pt states gabapentin has caused seizures   Levetiracetam Other (See Comments)    Pt states, "When I take Keppra it makes me feel like I am having a heart attack."    Levetiracetam Other (See Comments)   Nsaids Other (See Comments)    Pt reports aspirin and ibuprofen induce seizures     Outpatient Medications Prior to Visit  Medication Sig Dispense Refill   albuterol (VENTOLIN HFA) 108 (90 Base) MCG/ACT inhaler Inhale 2 puffs into the lungs  every 4 (four) hours as needed for wheezing or shortness of breath. 6.7 g 6   amLODipine (NORVASC) 10 MG tablet Take 10 mg by mouth daily.      atorvastatin (LIPITOR) 20 MG tablet Take 20 mg by mouth daily.  3   hydrochlorothiazide (HYDRODIURIL) 25 MG tablet Take 25 mg by mouth daily.     losartan (COZAAR) 25 MG tablet Take 25 mg by mouth daily.     metoprolol succinate (TOPROL-XL) 50 MG 24 hr tablet Take 50 mg by mouth daily. Take with or immediately following a meal.     oxyCODONE (ROXICODONE) 5 MG immediate release tablet Take 1 tablet (5 mg total) by mouth every 8 (eight) hours as needed (pain not relieved by tylenol or ibuprofen). Alternate tylenol and ibuprofen for the first few days. Take narcotic pain medication only if needed for severe/ breakthrough pain. 10 tablet 0   Oxycodone HCl 10 MG TABS Take 10 mg by mouth 5 (five) times daily.  0   rosuvastatin (CRESTOR) 40 MG tablet      umeclidinium-vilanterol (ANORO ELLIPTA) 62.5-25 MCG/INH AEPB Inhale 1 puff into the lungs daily. 60 each 11   umeclidinium-vilanterol (ANORO ELLIPTA) 62.5-25 MCG/INH AEPB Inhale 1 puff into the lungs daily. 14 each 0   No facility-administered medications prior to visit.    Review of Systems  Constitutional:  Negative for chills, diaphoresis, fever, malaise/fatigue and weight loss.  HENT:  Negative for congestion.  Respiratory:  Positive for cough, shortness of breath and wheezing. Negative for hemoptysis and sputum production.   Cardiovascular:  Negative for chest pain, palpitations and leg swelling.    Objective:   Vitals:   09/01/21 0923  BP: 134/82  Pulse: (!) 53  Temp: (!) 97.5 F (36.4 C)  SpO2: 98%  Weight: 173 lb (78.5 kg)  Height: 5\' 7"  (1.702 m)   SpO2: 98 % O2 Device: None (Room air)  Physical Exam: General: Well-appearing, no acute distress HENT: Shawn Potts, AT Eyes: EOMI, no scleral icterus Respiratory: Clear to auscultation bilaterally.  No crackles, wheezing or  rales Cardiovascular: RRR, -M/R/G, no JVD Extremities:-Edema,-tenderness Neuro: AAO x4, CNII-XII grossly intact Psych: Normal mood, normal affect  Data Reviewed:  Imaging: CT Chest Lung screen 04/12/21- Centrilobular and paraseptal emphysema. Tiny centrilobular GGO bilaterally with upper lobe predominance suggestive of respiratory bronchiolitis. Calcified granulomas bilaterally.  PFT: None of file  Labs: CBC    Component Value Date/Time   WBC 8.2 10/23/2018 1316   RBC 4.31 10/23/2018 1316   HGB 15.6 09/18/2020 0912   HCT 46.0 09/18/2020 0912   PLT 360 10/23/2018 1316   MCV 91.9 10/23/2018 1316   MCH 29.7 10/23/2018 1316   MCHC 32.3 10/23/2018 1316   RDW 13.1 10/23/2018 1316   LYMPHSABS 2.0 11/03/2015 0842   MONOABS 0.4 11/03/2015 0842   EOSABS 0.1 11/03/2015 0842   BASOSABS 0.0 11/03/2015 0842   Absolute eos 11/03/15 - 100     Assessment & Plan:   Discussion: 55 year old male active smoker with emphysema with chronic bronchitis. Partial improvement on LABA/LAMA. Continues to have symptoms. Not in exacerbation. We reviewed bronchodilators and exacerbation management and smoking cessation.  Emphysema --STOP Anoro --START Trelegy 200-62.5-25 mcg ONE puff ONCE a day --CONTINUE Albuterol as needed for shortness of breath and wheezing. REFILL --ARRANGE for pulmonary function tests prior to next visit  Tobacco abuse Patient is an active smoker. We discussed smoking cessation for 5 minutes. We discussed triggers and stressors and ways to deal with them. We discussed barriers to continued smoking and benefits of smoking cessation. Provided patient with information cessation techniques and interventions including [place] quitline.  Health Maintenance Immunization History  Administered Date(s) Administered   Moderna Sars-Covid-2 Vaccination 04/10/2020, 05/08/2020, 11/16/2020   Pneumococcal Polysaccharide-23 04/09/2018   Td 02/14/1967   Tdap 12/28/2011, 08/07/2013   CT Lung  Screen - enrolled. Due 04/2022  Orders Placed This Encounter  Procedures   Pulmonary function test    Standing Status:   Future    Standing Expiration Date:   09/01/2022    Scheduling Instructions:     With office visit    Order Specific Question:   Where should this test be performed?    Answer:   [facility]    Order Specific Question:   Full PFT: includes the following: basic spirometry, spirometry pre & post bronchodilator, diffusion capacity (DLCO), lung volumes    Answer:   Full PFT   Meds ordered this encounter  Medications   Fluticasone-Umeclidin-Vilant (TRELEGY ELLIPTA) 200-62.5-25 MCG/ACT AEPB    Sig: Inhale 1 puff into the lungs daily.    Dispense:  60 each    Refill:  3   albuterol (VENTOLIN HFA) 108 (90 Base) MCG/ACT inhaler    Sig: Inhale 2 puffs into the lungs every 4 (four) hours as needed for wheezing or shortness of breath.    Dispense:  6.7 g    Refill:  6   Return in about  6 months (around 03/02/2022).  I have spent a total time of 35-minutes on the day of the appointment reviewing prior documentation, coordinating care and discussing medical diagnosis and plan with the patient/family. Past medical history, allergies, medications were reviewed. Pertinent imaging, labs and tests included in this note have been reviewed and interpreted independently by me.  Khalilah Hoke Mechele Collin, MD  Pulmonary Critical Care 09/01/2021 9:58 AM  Office Number [phone]

## 2021-09-01 NOTE — Patient Instructions (Signed)
Emphysema --STOP Anoro --START Trelegy 200-62.5-25 mcg ONE puff ONCE a day --CONTINUE Albuterol as needed for shortness of breath and wheezing. REFILL --ARRANGE for pulmonary function tests prior to next visit  Tobacco abuse Patient is an active smoker. We discussed smoking cessation for 5 minutes. We discussed triggers and stressors and ways to deal with them. We discussed barriers to continued smoking and benefits of smoking cessation. Provided patient with information cessation techniques and interventions including [place] quitline.  Follow-up with me in 6 months with PFTs prior to visit

## 2021-10-05 NOTE — Progress Notes (Signed)
Called and left voicemail for Dr. Warren Danes office informing them that patient would like to cancel

## 2021-10-06 ENCOUNTER — Telehealth: Payer: Self-pay | Admitting: Orthopaedic Surgery

## 2021-10-06 NOTE — Telephone Encounter (Signed)
Patient cancelling left shoulder surgery with Dr. Roda Shutters on 10-13-21 at  [facility] because he will be starting a new job and can't take time off. Also he wants to do PT instead.  Patient asking for someone to call him so he can get started with Physical Therapy.

## 2021-10-06 NOTE — Telephone Encounter (Signed)
yes

## 2021-10-07 ENCOUNTER — Other Ambulatory Visit: Payer: Self-pay

## 2021-10-07 DIAGNOSIS — M7542 Impingement syndrome of left shoulder: Secondary | ICD-10-CM

## 2021-10-07 NOTE — Telephone Encounter (Signed)
Patient aware.

## 2021-10-07 NOTE — Telephone Encounter (Signed)
Referral made 

## 2021-10-13 ENCOUNTER — Ambulatory Visit (HOSPITAL_BASED_OUTPATIENT_CLINIC_OR_DEPARTMENT_OTHER)
Admission: RE | Admit: 2021-10-13 | Payer: Medicare (Managed Care) | Source: Home / Self Care | Admitting: Orthopaedic Surgery

## 2021-10-13 ENCOUNTER — Encounter (HOSPITAL_BASED_OUTPATIENT_CLINIC_OR_DEPARTMENT_OTHER): Admission: RE | Payer: Self-pay | Source: Home / Self Care

## 2021-10-13 SURGERY — SHOULDER ARTHROSCOPY WITH ROTATOR CUFF REPAIR AND SUBACROMIAL DECOMPRESSION
Anesthesia: General | Laterality: Left

## 2021-10-14 ENCOUNTER — Ambulatory Visit: Payer: Medicare (Managed Care) | Admitting: Physical Therapy

## 2021-10-19 NOTE — Therapy (Incomplete)
OUTPATIENT PHYSICAL THERAPY SHOULDER EVALUATION   Patient Name: Shawn Potts MRN: [id number] DOB:1966/09/22, 55 y.o., male Today's Date: 10/19/2021    Past Medical History:  Diagnosis Date   Asthma    bronchial asthma,    High cholesterol    Hypertension    Seizures (HCC) last seizure 09-2019   Umbilical hernia    Past Surgical History:  Procedure Laterality Date   BRAIN SURGERY  x 4, lasy 2005 or 2006   BRAIN SURGERY     SHOULDER SURGERY Right yrs ago   SPINAL FIXATION SURGERY  yrs ago   cervical fusion   surgery on head  age 41   fell thru glass window, neck seriously injured   UMBILICAL HERNIA REPAIR N/A 09/18/2020   Procedure: OPEN UMBILICAL HERNIA REPAIR;  Surgeon: Berna Bue, MD;  Location: [facility];  Service: General;  Laterality: N/A;   Patient Active Problem List   Diagnosis Date Noted   Traumatic tear of supraspinatus tendon of left shoulder 08/10/2021   Impingement syndrome of left shoulder 08/10/2021   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   ONSET DATE: 04/29/2021  SUBJECTIVE:                                                                                                                                                                                     SUBJECTIVE STATEMENT: Patient reports left shoulder pain following left clavicle fracture and left shoulder injury that occurred on 04/29/2021 when piece of wood fell on left shoulder. ***  PERTINENT HISTORY: ***  PAIN:  Are you having pain? Yes VAS scale: ***/10 Pain location: Left shoulder Pain orientation: {Pain Orientation:25161}  PAIN  TYPE: {type:313116} Pain description: {PAIN DESCRIPTION:21022940}  Aggravating factors: *** Relieving factors: ***  PRECAUTIONS: None  WEIGHT BEARING RESTRICTIONS No  FALLS:  Has patient fallen in last 6 months? {yes/no:20286} Number of falls: ***  LIVING ENVIRONMENT: Lives with: {[facility] lives with:25569::"lives with their family"} Lives in: {Lives in:25570} Stairs: {yes/no:20286}; {Stairs:24000} Has following equipment at home: {Assistive devices:23999}  PLOF: Independent  PATIENT GOALS: Pain relief and ***  OBJECTIVE:  DIAGNOSTIC FINDINGS:  X-ray 07/08/21: Healed medial clavicle fracture.  Degenerative changes at the [facility] joint.  No acute abnormalities.  MRI: Healed scapular spine fracture that is likely from the previous trauma earlier in the summer.  He does have AC joint arthrosis which is severe and a high-grade partial tear of the distal supraspinatus tendon. (performed at [facility])  PATIENT SURVEYS:  FOTO ***  COGNITION:  Overall cognitive status: Within functional limits for tasks assessed     SENSATION:  Light touch: Appears intact  PALPATION: ***  POSTURE: ***  UPPER EXTREMITY AROM/PROM:  A/PROM Right 10/19/2021 Left 10/19/2021  Shoulder flexion    Shoulder extension    Shoulder abduction    Shoulder adduction    Shoulder internal rotation    Shoulder external rotation    Elbow flexion    Elbow extension    Wrist flexion    Wrist extension    Wrist ulnar deviation    Wrist radial deviation    Wrist pronation    Wrist supination    (Blank rows = not tested)  UPPER EXTREMITY MMT:  MMT Right 10/19/2021 Left 10/19/2021  Shoulder flexion    Shoulder extension    Shoulder abduction    Shoulder adduction    Middle trapezius    Lower trapezius    Elbow flexion    Elbow extension    Wrist flexion    Wrist extension    Wrist ulnar deviation    Wrist radial deviation    Wrist pronation    Wrist supination    Grip strength    (Blank  rows = not tested)  SHOULDER SPECIAL TESTS:  Impingement tests: {shoulder impingement test:25231:a}  SLAP lesions: {SLAP lesions:25232}  Instability tests: {shoulder instability test:25233}  Rotator cuff assessment: {rotator cuff assessment:25234}  Biceps assessment: {biceps assessment:25235}  JOINT MOBILITY TESTING:  ***  TODAY'S TREATMENT:  ***   PATIENT EDUCATION: Education details: Exam findings, POC, HEP Person educated: Patient Education method: Explanation, Demonstration, Tactile cues, Verbal cues, and Handouts Education comprehension: verbalized understanding, returned demonstration, verbal cues required, tactile cues required, and needs further education   HOME EXERCISE PROGRAM: ***  ASSESSMENT: CLINICAL IMPRESSION: Patient is a 55 y.o. male who was seen today for physical therapy evaluation and treatment for left shoulder pain following traumatic injury on 04/29/2021. Objective impairments include {opptimpairments:25111}. These impairments are limiting patient from {activity limitations:25113}. Personal factors including {Personal factors:25162} are also affecting patient's functional outcome. Patient will benefit from skilled PT to address above impairments and improve overall function.  REHAB POTENTIAL: {rehabpotential:25112}  CLINICAL DECISION MAKING: Stable/uncomplicated  EVALUATION COMPLEXITY: Low   GOALS: Goals reviewed with patient? Yes  SHORT TERM GOALS:  STG Name Target Date Goal status  1 Patient will be I with initial HEP in order to progress with therapy. Baseline:  {follow up:25551} INITIAL  2 PT will review FOTO with patient by 3rd visit in order to understand expected progress and outcome with therapy. Baseline:  {follow up:25551} INITIAL  3 Patient will report pain level </= ***/10 with *** to reduce functional limitation Baseline: {follow up:25551} {GOALSTATUS:25110}  4 *** Baseline: {follow up:25551} {GOALSTATUS:25110}  5 *** Baseline:  {follow up:25551} {GOALSTATUS:25110}  6 *** Baseline: {follow up:25551} {GOALSTATUS:25110}  7 *** Baseline: {follow up:25551} {GOALSTATUS:25110}   LONG TERM GOALS:   LTG Name Target Date Goal status  1 Patient will be I with final HEP to maintain progress from PT. Baseline: {follow up:25551} INITIAL  2 Patient will report >/= ***% status on FOTO to indicate improved functional ability. Baseline: {follow up:25551} INITIAL  3 Patient will demonstrate improved range of motion of *** >/= *** deg in order to improve ability to *** Baseline: {follow up:25551} INITIAL  4 Patient  will demonstrate improvement in strength of *** >/= *** in order to improve ***. Baseline: {follow up:25551} INITIAL  5 *** Baseline: {follow up:25551} {GOALSTATUS:25110}  6 *** Baseline: {follow up:25551} {GOALSTATUS:25110}  7 *** Baseline: {follow up:25551} {GOALSTATUS:25110}   PLAN: PT FREQUENCY: {rehab frequency:25116}  PT DURATION: {rehab duration:25117}  PLANNED INTERVENTIONS: {rehab planned interventions:25118::"Therapeutic exercises","Therapeutic activity","Neuro Muscular re-education","Balance training","Gait training","Patient/Family education","Joint mobilization"}  PLAN FOR NEXT SESSION: Review HEP and progress PRN, ***   Rosana Hoes, PT, DPT, LAT, ATC 10/19/21  2:16 PM Phone: [phone] Fax: [phone]

## 2021-10-20 ENCOUNTER — Encounter: Payer: Medicare (Managed Care) | Admitting: Orthopaedic Surgery

## 2021-10-20 ENCOUNTER — Ambulatory Visit: Payer: Medicare (Managed Care) | Attending: Orthopaedic Surgery | Admitting: Physical Therapy

## 2021-12-29 DIAGNOSIS — M62830 Muscle spasm of back: Secondary | ICD-10-CM | POA: Diagnosis not present

## 2021-12-29 DIAGNOSIS — M5416 Radiculopathy, lumbar region: Secondary | ICD-10-CM | POA: Diagnosis not present

## 2021-12-29 DIAGNOSIS — R030 Elevated blood-pressure reading, without diagnosis of hypertension: Secondary | ICD-10-CM | POA: Diagnosis not present

## 2021-12-29 DIAGNOSIS — M546 Pain in thoracic spine: Secondary | ICD-10-CM | POA: Diagnosis not present

## 2021-12-29 DIAGNOSIS — F17210 Nicotine dependence, cigarettes, uncomplicated: Secondary | ICD-10-CM | POA: Diagnosis not present

## 2021-12-29 DIAGNOSIS — Z0000 Encounter for general adult medical examination without abnormal findings: Secondary | ICD-10-CM | POA: Diagnosis not present

## 2021-12-29 DIAGNOSIS — M542 Cervicalgia: Secondary | ICD-10-CM | POA: Diagnosis not present

## 2021-12-29 DIAGNOSIS — F17200 Nicotine dependence, unspecified, uncomplicated: Secondary | ICD-10-CM | POA: Diagnosis not present

## 2021-12-29 DIAGNOSIS — Z79899 Other long term (current) drug therapy: Secondary | ICD-10-CM | POA: Diagnosis not present

## 2022-01-03 DIAGNOSIS — Z79899 Other long term (current) drug therapy: Secondary | ICD-10-CM | POA: Diagnosis not present

## 2022-01-18 ENCOUNTER — Other Ambulatory Visit: Payer: Self-pay

## 2022-01-18 ENCOUNTER — Ambulatory Visit (INDEPENDENT_AMBULATORY_CARE_PROVIDER_SITE_OTHER): Payer: 59

## 2022-01-18 ENCOUNTER — Ambulatory Visit (INDEPENDENT_AMBULATORY_CARE_PROVIDER_SITE_OTHER): Payer: 59 | Admitting: Orthopaedic Surgery

## 2022-01-18 DIAGNOSIS — M79602 Pain in left arm: Secondary | ICD-10-CM

## 2022-01-18 MED ORDER — PREDNISONE 10 MG (21) PO TBPK: ORAL_TABLET | ORAL | 0 refills | Status: DC

## 2022-01-18 NOTE — Progress Notes (Signed)
? ?Office Visit Note ?  ?Patient: Shawn Potts           ?Date of Birth: 1966/03/21           ?MRN: [id number] ?Visit Date: 01/18/2022 ?             ?Requested by: Deatra James, MD ?[address] ?[address] ?[place],  [place] [address] ?PCP: Deatra James, MD ? ? ?Assessment & Plan: ?Visit Diagnoses:  ?1. Left arm pain   ? ? ?Plan: Impression is cervical spine radiculopathy left upper extremity.  At this point, we have discussed various treatment options to include medications, referral to physical therapy versus referral to spine surgery.  He would like to try steroid pack and go ahead and get a referral to spine surgery.  He will follow-up with [place] as needed. ? ?Follow-Up Instructions: Return if symptoms worsen or fail to improve.  ? ?Orders:  ?Orders Placed This Encounter  ?Procedures  ? XR Cervical Spine 2 or 3 views  ? ?No orders of the defined types were placed in this encounter. ? ? ? ? Procedures: ?No procedures performed ? ? ?Clinical Data: ?No additional findings. ? ? ?Subjective: ?Chief Complaint  ?Patient presents with  ? Neck - Pain  ? ? ?HPI patient is a pleasant 56 year old gentleman who comes in today with left-sided neck pain.  This has been ongoing for the past 3 weeks after rotating his neck to the left causing significant pain and weakness to the left upper extremity.  He was initially seen by his primary care provider and has been referred to [place].  The pain is primarily to the left lateral neck and radiates down the left shoulder.  He denies any weakness of the left upper extremity.  He has been taking Percocet 15 mg without relief.  He does note paresthesias into the fingers of the left hand.  Of note, he has a history of left shoulder supraspinatus tear. ? ?Review of Systems as detailed in HPI.  All others reviewed and are negative. ? ? ?Objective: ?Vital Signs: There were no vitals taken for this visit. ? ?Physical Exam well-nourished gentleman in no acute distress.  Alert and oriented  x3. ? ?Ortho Exam cervical spine exam shows limited flexion, extension and rotation with pain with left-sided rotation.  No spinous tenderness.  He does have left sided tenderness to the paraspinous musculature.  No focal weakness.  He is neurovascular intact distally. ? ?Specialty Comments:  ?No specialty comments available. ? ?Imaging: ?XR Cervical Spine 2 or 3 views ? ?Result Date: 01/18/2022 ?Previous fusion C6-7.  Marked spondylosis with anterior osteophyte formation C3-4 and C5-6  ? ? ?PMFS History: ?Patient Active Problem List  ? Diagnosis Date Noted  ? Traumatic tear of supraspinatus tendon of left shoulder 08/10/2021  ? Impingement syndrome of left shoulder 08/10/2021  ? 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 Pulmonary emphysema (HCC) 07/22/2021  ? Chest pain 02/06/2012  ? HTN (hypertension) 02/06/2012  ? Seizure disorder (HCC) 02/06/2012  ? Tobacco abuse 02/06/2012  ? ?Past Medical History:  ?Diagnosis Date  ? Asthma   ? bronchial asthma,   ? High cholesterol   ? Hypertension   ? Seizures (HCC) last seizure 09-2019  ? Umbilical hernia   ?  ?No family history on file.  ?Past Surgical History:  ?Procedure Laterality Date  ? BRAIN SURGERY  x 4, lasy 2005 or 2006  ? BRAIN SURGERY    ? SHOULDER SURGERY Right yrs  ago  ? SPINAL FIXATION SURGERY  yrs ago  ? cervical fusion  ? surgery on head  age 39  ? fell thru glass window, neck seriously injured  ? UMBILICAL HERNIA REPAIR N/A 09/18/2020  ? Procedure: OPEN UMBILICAL HERNIA REPAIR;  Surgeon: Berna Bue, MD;  [facility];  Service: General;  Laterality: N/A;  ? ?Social History  ? ?Occupational History  ? Not on file  ?Tobacco Use  ? Smoking status: Every Day  ?  Packs/day: 0.50  ?  Years: 40.00  ?  Pack years: 20.00  ?  Types: Cigarettes  ? Smokeless tobacco: Never  ? Tobacco comments:  ?  0.5 ppd  ?Vaping Use  ? Vaping Use: Never used  ?Substance and Sexual Activity  ? Alcohol use: No  ? Drug use: Yes  ?  Types:  Marijuana  ?  Comment:  used last week  09-15-2020  ? Sexual activity: Not on file  ? ? ? ? ? ? ?

## 2022-01-20 ENCOUNTER — Other Ambulatory Visit: Payer: Self-pay

## 2022-01-24 NOTE — Therapy (Signed)
?OUTPATIENT PHYSICAL THERAPY SHOULDER EVALUATION ? ? ?Patient Name: Shawn Potts ?MRN: [id number] ?DOB:[birth year], 56 y.o., male ?Today's Date: 01/25/2022 ? ? PT End of Session - 01/25/22 1534   ? ? Visit Number 1   ? Number of Visits 13   ? Date for PT Re-Evaluation 03/12/22   ? Authorization Type MCD- requesting auth   ? PT Start Time 1520   ? PT Stop Time 1559   ? PT Time Calculation (min) 39 min   ? Activity Tolerance Patient tolerated treatment well   ? Behavior During Therapy [facility] for tasks assessed/performed   ? ?  ?  ? ?  ? ? ?Past Medical History:  ?Diagnosis Date  ? Asthma   ? bronchial asthma,   ? High cholesterol   ? Hypertension   ? Seizures (HCC) last seizure 09-2019  ? Umbilical hernia   ? ?Past Surgical History:  ?Procedure Laterality Date  ? BRAIN SURGERY  x 4, lasy 2005 or 2006  ? BRAIN SURGERY    ? SHOULDER SURGERY Right yrs ago  ? SPINAL FIXATION SURGERY  yrs ago  ? cervical fusion  ? surgery on head  age 2  ? fell thru glass window, neck seriously injured  ? UMBILICAL HERNIA REPAIR N/A 09/18/2020  ? Procedure: OPEN UMBILICAL HERNIA REPAIR;  Surgeon: Berna Bue, MD;  Location: [facility] [place];  Service: General;  Laterality: N/A;  ? ?Patient Active Problem List  ? Diagnosis Date Noted  ? Traumatic tear of supraspinatus tendon of left shoulder 08/10/2021  ? Impingement syndrome of left shoulder 08/10/2021  ? 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 Pulmonary emphysema (HCC) 07/22/2021  ? Chest pain 02/06/2012  ? HTN (hypertension) 02/06/2012  ? Seizure disorder (HCC) 02/06/2012  ? Tobacco abuse 02/06/2012  ? ? ?PCP: Deatra James, MD ? ?REFERRING PROVIDER: Tarry Kos, MD ? ?REFERRING DIAG: M75.42 (ICD-10-CM) - Impingement syndrome of left shoulder  ? ?THERAPY DIAG:  ?Chronic left shoulder pain ? ?Muscle weakness (generalized) ? ?Abnormal posture ? ? ?ONSET DATE: Summer 2021  ? ?SUBJECTIVE:                                                                                                                                                                                      ? ?SUBJECTIVE STATEMENT: ?Patient reports he has a "somewhat torn rotator cuff." He was recently prescribed steroids, which has helped some with his pain. He reports injuring the shoulder about 1.5 years where a tree limb fell on his shoulder. He did not undergo surgery for this due to recovery time from surgical procedure. He has not received any injections in  the shoulder.  ? ?PERTINENT HISTORY: ?Previous traumatic cervical injury as a teenager (fell through glass window) ?Per patient Cervical fusion 2007 ? ? ?PAIN:  ?Are you having pain? Yes: NPRS scale: 4/10 ?Pain location: Lt lateral shoulder ?Pain description: burning, irritation ?Aggravating factors: lifting, sleep ?Relieving factors: medication ? ?PRECAUTIONS: Fall risk due to epilepsy (has not experienced a seizure in 1.5 years) ? ?WEIGHT BEARING RESTRICTIONS No ? ?FALLS:  ?Has patient fallen in last 6 months? No Number of falls: 0 ? ?LIVING ENVIRONMENT: ?Lives with: lives with their spouse ?Lives in: House/apartment ?Stairs: Yes; stairs to enter  ?Has following equipment at home: None ? 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 ?PLOF: Independent ? ?PATIENT GOALS "I want to be able to use the shoulder somewhat, better than I can."  ? ?OBJECTIVE:  ? ?DIAGNOSTIC FINDINGS:  ?Per physician encounter in October 2022:  MRI of the left shoulder from tried imaging shows healed scapular spine fracture that is likely from the previous trauma earlier in the summer. He does have AC joint arthrosis which is severe and a high-grade partial tear of the distal supraspinatus tendon.  ? ?Cervical x-ray: Previous fusion C6-7.  Marked spondylosis with anterior osteophyte  ?formation C3-4 and C5-6  ? ?PATIENT SURVEYS:  ?Quick Dash 50% disability ? ?COGNITION: ? Overall cognitive status: Within functional limits for tasks assessed ?    ?SENSATION: ?Not  tested ? ?POSTURE: ?Forward head, rounded shoulders  ? ?UPPER EXTREMITY ROM:  ? ?Active ROM Right ?01/25/2022 Left ?01/25/2022  ?Shoulder flexion  130 pain on descent  ?Shoulder extension    ?Shoulder abduction  110 pain  ?Shoulder adduction    ?Shoulder internal rotation  WNL  ?Shoulder external rotation  50 pain  ?Elbow flexion    ?Elbow extension    ?Wrist flexion    ?Wrist extension    ?Wrist ulnar deviation    ?Wrist radial deviation    ?Wrist pronation    ?Wrist supination    ?(Blank rows = not tested) ? ?UPPER EXTREMITY MMT: ? ?MMT Right ?01/25/2022 Left ?01/25/2022  ?Shoulder flexion 5/5 4-/5  ?Shoulder extension    ?Shoulder abduction 5/5 5/5  ?Shoulder adduction    ?Shoulder internal rotation 5/5 4+/5  ?Shoulder external rotation 5/5 4/5  ?Middle trapezius    ?Lower trapezius    ?Elbow flexion    ?Elbow extension    ?Wrist flexion    ?Wrist extension    ?Wrist ulnar deviation    ?Wrist radial deviation    ?Wrist pronation    ?Wrist supination    ?Grip strength (lbs)    ?(Blank rows = not tested) ? ?SHOULDER SPECIAL TESTS: ? (+) Empty can (+) Neers (+) Hawkin's Kennedy  ? ?JOINT MOBILITY TESTING:  ?Lt GHJ hypomobile all planes ? ?PALPATION:  ?TTP greater tubercle, clavicle  ?  ?[facility]  [facility]                                                [facility]: [facility] ?Therapeutic Exercise: ?Demonstrated and issue initial HEP.  ? ? ?Therapeutic Activity: ?Education on assessment findings that will be addressed throughout duration of POC.  ? ? ? ? ?PATIENT EDUCATION: ?Education details: see treatment  ?Person educated: Patient ?Education method: Explanation, Demonstration, Tactile cues, Verbal cues, and Handouts ?Education comprehension: verbalized understanding, returned demonstration, verbal cues required, tactile cues  required, and needs further education ? ? ?HOME EXERCISE PROGRAM: ?Access Code: [email or web address] ?URL: [email or web address] ?Date: 01/25/2022 ?Prepared by: Letitia Libra ? ?Exercises ?Seated Scapular Retraction - 1 x daily - 7 x weekly - 3 sets - 10 reps - 5 sec hold ?Doorway Pec Stretch at 60 Degrees Abduction with Arm Straight - 1 x daily - 7 x weekly - 3 sets - 30 sec hold ?Standing Isometric Shoulder External Rotation with Doorway - 1 x daily - 7 x weekly - 2 sets - 10 reps - 5 sec hold ?Standing Isometric Shoulder Abduction with Doorway - 1 x daily - 7 x weekly - 2 sets - 10 reps - 5 sec hold ? ? ?ASSESSMENT: ? ?CLINICAL IMPRESSION: ?Patient is a 56 y.o. Rt-handed male who was seen today for physical therapy evaluation and treatment for chronic left shoulder pain that began in 2021 when a tree limb fell on his shoulder. Previous imaging of the Lt shoulder reveals the following: healed scapular spine fracture that is likely from the previous trauma, sever AC joint arthrosis, and a high-grade partial tear of the distal supraspinatus tendon. His current signs and symptoms are consistent with a RTC tear. He will benefit from skilled PT to address his Lt shoulder pain, ROM impairments, strength deficits, and postural dysfunction in order to optimize his function.  ? ? ?OBJECTIVE IMPAIRMENTS decreased ROM, decreased strength, hypomobility, impaired UE functional use, postural dysfunction, and pain.  ? ?ACTIVITY LIMITATIONS cleaning, driving, yard work, and shopping.  ? ?PERSONAL FACTORS Age, Profession, Time since onset of injury/illness/exacerbation, and 3+ comorbidities: see PMH and surgical history above  are also affecting patient's functional outcome.  ? ? ?REHAB POTENTIAL: Fair time since onset; comorbidities ? ?CLINICAL DECISION MAKING: Stable/uncomplicated ? ?EVALUATION COMPLEXITY: Low ? ? ?GOALS: ?Goals reviewed with patient? No ? ?SHORT TERM GOALS: Target date: 02/15/2022 ? ?Patient will be independent and compliant with initial HEP.  ? ?Baseline: issued at eval ?Goal status: INITIAL ? ?2.  Patient will demonstrate knowledge and application of appropriate sitting  posture.  ?Baseline: forward head; rounded shoulders  ?Goal status: INITIAL ? ? ? ?LONG TERM GOALS: Target date: 03/08/2022 ? ?Patient will demonstrate at least 150 degrees of Lt shoulder flexion and abduction AROM to improve ability t

## 2022-01-25 ENCOUNTER — Ambulatory Visit: Payer: 59 | Attending: Orthopaedic Surgery

## 2022-01-25 ENCOUNTER — Other Ambulatory Visit: Payer: Self-pay | Admitting: Physician Assistant

## 2022-01-25 ENCOUNTER — Other Ambulatory Visit: Payer: Self-pay

## 2022-01-25 DIAGNOSIS — M6281 Muscle weakness (generalized): Secondary | ICD-10-CM | POA: Insufficient documentation

## 2022-01-25 DIAGNOSIS — M7542 Impingement syndrome of left shoulder: Secondary | ICD-10-CM | POA: Diagnosis not present

## 2022-01-25 DIAGNOSIS — M25512 Pain in left shoulder: Secondary | ICD-10-CM | POA: Diagnosis not present

## 2022-01-25 DIAGNOSIS — G8929 Other chronic pain: Secondary | ICD-10-CM | POA: Insufficient documentation

## 2022-01-25 DIAGNOSIS — R293 Abnormal posture: Secondary | ICD-10-CM | POA: Diagnosis present

## 2022-02-07 ENCOUNTER — Other Ambulatory Visit: Payer: Self-pay | Admitting: Orthopaedic Surgery

## 2022-02-07 DIAGNOSIS — M4322 Fusion of spine, cervical region: Secondary | ICD-10-CM

## 2022-02-07 DIAGNOSIS — M5412 Radiculopathy, cervical region: Secondary | ICD-10-CM

## 2022-02-07 NOTE — Therapy (Incomplete)
?OUTPATIENT PHYSICAL THERAPY TREATMENT NOTE ? ? ?Patient Name: Shawn Potts ?MRN: [id number] ?DOB:1966/04/20, 56 y.o., male ?Today's Date: 02/07/2022 ? ?PCP: Donald Prose, MD ?REFERRING PROVIDER: Donald Prose, MD ? ? ? ?Past Medical History:  ?Diagnosis Date  ? Asthma   ? bronchial asthma,   ? High cholesterol   ? Hypertension   ? Seizures ([place]) last seizure 09-2019  ? Umbilical hernia   ? ?Past Surgical History:  ?Procedure Laterality Date  ? BRAIN SURGERY  x 4, lasy 2005 or 2006  ? BRAIN SURGERY    ? SHOULDER SURGERY Right yrs ago  ? SPINAL FIXATION SURGERY  yrs ago  ? cervical fusion  ? surgery on head  age 66  ? fell thru glass window, neck seriously injured  ? UMBILICAL HERNIA REPAIR N/A 09/18/2020  ? Procedure: OPEN UMBILICAL HERNIA REPAIR;  Surgeon: Clovis Riley, MD;  Location: [facility];  Service: General;  Laterality: N/A;  ? ?Patient Active Problem List  ? Diagnosis Date Noted  ? Traumatic tear of supraspinatus tendon of left shoulder 08/10/2021  ? Impingement syndrome of left shoulder 08/10/2021  ? 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 Pulmonary emphysema ([place]) 07/22/2021  ? Chest pain 02/06/2012  ? HTN (hypertension) 02/06/2012  ? Seizure disorder ([place]) 02/06/2012  ? Tobacco abuse 02/06/2012  ? ? ?REFERRING PROVIDER: Leandrew Koyanagi, MD ?  ?REFERRING DIAG: Impingement syndrome of left shoulder  ? ?THERAPY DIAG:  ?No diagnosis found. ? ?PERTINENT HISTORY: Previous traumatic cervical injury as a teenager (fell through glass window), Per patient Cervical fusion 2007 ? ?PRECAUTIONS: Fall risk due to epilepsy (has not experienced a seizure in 1.5 years) ? ?SUBJECTIVE: *** ? ?PAIN:  ?Are you having pain? Yes:  ?NPRS scale: 4/10 ?Pain location: Lt lateral shoulder ?Pain description: burning, irritation ?Aggravating factors: lifting, sleep ?Relieving factors: medication ? ?PATIENT GOALS "I want to be able to use the shoulder somewhat, better than I can."  ? ? ?OBJECTIVE:   ?PATIENT SURVEYS:  ?Quick Dash 50% disability ?  ?POSTURE: ?Forward head, rounded shoulders  ?  ?UPPER EXTREMITY ROM:  ?  ?Active ROM Right ?01/25/2022 Left ?01/25/2022  ?Shoulder flexion   130 pain on descent  ?Shoulder extension      ?Shoulder abduction   110 pain  ?Shoulder adduction      ?Shoulder internal rotation   WNL  ?Shoulder external rotation   50 pain  ?(Blank rows = not tested) ?  ?UPPER EXTREMITY MMT: ?  ?MMT Right ?01/25/2022 Left ?01/25/2022  ?Shoulder flexion 5/5 4-/5  ?Shoulder extension      ?Shoulder abduction 5/5 5/5  ?Shoulder adduction      ?Shoulder internal rotation 5/5 4+/5  ?Shoulder external rotation 5/5 4/5  ?Middle trapezius      ?Lower trapezius      ?(Blank rows = not tested) ?  ?SHOULDER SPECIAL TESTS: ?           (+) Empty can (+) Neers (+) Hawkin's Kennedy  ?  ?JOINT MOBILITY TESTING:  ?Lt GHJ hypomobile all planes ?  ?PALPATION:  ?TTP greater tubercle, clavicle  ?            ? ?TODAY'S TREATMENT:  ?[facility] Adult PT Treatment:  DATE: 02/08/2022 ?Therapeutic Exercise: ?*** ?Manual Therapy: ?*** ?Neuromuscular re-ed: ?*** ?Therapeutic Activity: ?*** ?Modalities: ?*** ?Self Care: ?*** ? ?   ?PATIENT EDUCATION: ?Education details: HEP ?Person educated: Patient ?Education method: Explanation, Demonstration, Tactile cues, Verbal cues, and Handouts ?Education comprehension: verbalized understanding, returned demonstration, verbal cues required, tactile cues required, and needs further education ?  ?HOME EXERCISE PROGRAM: ?Access Code: [email or web address] ?  ?  ?ASSESSMENT: ?CLINICAL IMPRESSION: ?Patient tolerated therapy well with no adverse effects. *** He will benefit from skilled PT to address his Lt shoulder pain, ROM impairments, strength deficits, and postural dysfunction in order to optimize his function.  ? ?Patient is a 56 y.o. Rt-handed male who was seen today for physical therapy evaluation and treatment for chronic left shoulder pain that began in  2021 when a tree limb fell on his shoulder. Previous imaging of the Lt shoulder reveals the following: healed scapular spine fracture that is likely from the previous trauma, sever AC joint arthrosis, and a high-grade partial tear of the distal supraspinatus tendon. His current signs and symptoms are consistent with a RTC tear. ?  ?  ?OBJECTIVE IMPAIRMENTS decreased ROM, decreased strength, hypomobility, impaired UE functional use, postural dysfunction, and pain.  ?  ?ACTIVITY LIMITATIONS cleaning, driving, yard work, and shopping.  ?  ?PERSONAL FACTORS Age, Profession, Time since onset of injury/illness/exacerbation, and 3+ comorbidities: see PMH and surgical history above  are also affecting patient's functional outcome.  ?  ?  ?GOALS: ?Goals reviewed with patient? No ?  ?SHORT TERM GOALS: Target date: 02/15/2022 ?  ?Patient will be independent and compliant with initial HEP.  ?  ?Baseline: issued at eval ?Goal status: INITIAL ?  ?2.  Patient will demonstrate knowledge and application of appropriate sitting posture.  ?Baseline: forward head; rounded shoulders  ?Goal status: INITIAL ?  ?  ?  ?LONG TERM GOALS: Target date: 03/08/2022 ?  ?Patient will demonstrate at least 150 degrees of Lt shoulder flexion and abduction AROM to improve ability to complete overhead reaching.  ?Baseline: see above  ?Goal status: INITIAL ?  ?2.  Patient will demonstrate at least 70 degrees of Lt shoulder ER AROM to improve ability to complete self-care activities.  ?Baseline: see above ?Goal status: INITIAL ?  ?3.  Patient will demonstrate at least 4+/5 Lt shoulder strength to improve GHJ stability necessary for lifting and carrying activities.  ?Baseline: see above  ?Goal status: INITIAL ?  ?3.  Patient will score </=35% disability on QuickDASH to signify clinically meaningful improvement in functional abilities.  ?Baseline: see above  ?Goal status: INITIAL ?  ?  ?PLAN: ?PT FREQUENCY: 2x/week ?  ?PT DURATION: 6 weeks ?  ?PLANNED  INTERVENTIONS: Therapeutic exercises, Therapeutic activity, Neuromuscular re-education, Patient/Family education, Joint mobilization, Dry Needling, Electrical stimulation, Cryotherapy, Moist heat, Taping, Vasopneumatic device, Ultrasound, Ionotophoresis 4mg /ml Dexamethasone, and Manual therapy ?  ?PLAN FOR NEXT SESSION: review HEP, progress shoulder ROM and strength as tolerated; consider ionto  ? ? ? ?Hilda Blades, PT, DPT, LAT, ATC ?02/07/22  8:42 AM ?Phone: [phone] ?Fax: [phone] ? ? ?  ? ?

## 2022-02-08 ENCOUNTER — Ambulatory Visit: Payer: 59 | Attending: Orthopaedic Surgery | Admitting: Physical Therapy

## 2022-02-08 ENCOUNTER — Telehealth: Payer: Self-pay | Admitting: Physical Therapy

## 2022-02-08 DIAGNOSIS — G8929 Other chronic pain: Secondary | ICD-10-CM | POA: Insufficient documentation

## 2022-02-08 DIAGNOSIS — R293 Abnormal posture: Secondary | ICD-10-CM | POA: Insufficient documentation

## 2022-02-08 DIAGNOSIS — M6281 Muscle weakness (generalized): Secondary | ICD-10-CM | POA: Insufficient documentation

## 2022-02-08 DIAGNOSIS — M25512 Pain in left shoulder: Secondary | ICD-10-CM | POA: Insufficient documentation

## 2022-02-08 NOTE — Telephone Encounter (Signed)
Contacted patient due to missed PT appointment. He reported death in the family which caused him to miss the appointment. He was reminded of next scheduled appointment and and attendance policy. Patient expressed understanding. ? ?Rosana Hoes, PT, DPT, LAT, ATC ?02/08/22  4:20 PM ?Phone: [phone] ?Fax: [phone] ? ?

## 2022-02-14 NOTE — Therapy (Signed)
?OUTPATIENT PHYSICAL THERAPY TREATMENT NOTE ? ? ?Patient Name: Shawn Potts ?MRN: [id number] ?DOB:26-Mar-1966, 56 y.o., male ?Today's Date: 02/15/2022 ? ?PCP: Deatra JamesSun, Vyvyan, MD ?REFERRING PROVIDER: Deatra JamesSun, Vyvyan, MD ? ? PT End of Session - 02/15/22 1455   ? ? Visit Number 2   ? Number of Visits 13   ? Date for PT Re-Evaluation 03/12/22   ? Authorization Type MCR / MCD   ? PT Start Time 1445   ? PT Stop Time 1525   ? PT Time Calculation (min) 40 min   ? Activity Tolerance Patient tolerated treatment well   ? [facility] for tasks assessed/performed   ? ?  ?  ? ?  ? ? ?Past Medical History:  ?Diagnosis Date  ? Asthma   ? bronchial asthma,   ? High cholesterol   ? Hypertension   ? Seizures (HCC) last seizure 09-2019  ? Umbilical hernia   ? ?Past Surgical History:  ?Procedure Laterality Date  ? BRAIN SURGERY  x 4, lasy 2005 or 2006  ? BRAIN SURGERY    ? SHOULDER SURGERY Right yrs ago  ? SPINAL FIXATION SURGERY  yrs ago  ? cervical fusion  ? surgery on head  age 56  ? fell thru glass window, neck seriously injured  ? UMBILICAL HERNIA REPAIR N/A 09/18/2020  ? Procedure: OPEN UMBILICAL HERNIA REPAIR;  Surgeon: Berna Bueonnor, Chelsea A, MD;  [facility];  Service: General;  Laterality: N/A;  ? ?Patient Active Problem List  ? Diagnosis Date Noted  ? Traumatic tear of supraspinatus tendon of left shoulder 08/10/2021  ? Impingement syndrome of left shoulder 08/10/2021  ? 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 Pulmonary emphysema (HCC) 07/22/2021  ? Chest pain 02/06/2012  ? HTN (hypertension) 02/06/2012  ? Seizure disorder (HCC) 02/06/2012  ? Tobacco abuse 02/06/2012  ? ? ?REFERRING PROVIDER: Tarry KosXu, Naiping M, MD ?  ?REFERRING DIAG: Impingement syndrome of left shoulder  ? ?THERAPY DIAG:  ?Chronic left shoulder pain ? ?Muscle weakness (generalized) ? ?Abnormal posture ? ?PERTINENT HISTORY: Previous traumatic cervical injury as a teenager (fell through glass window), Per patient  Cervical fusion 2007 ? ?PRECAUTIONS: Fall risk due to epilepsy (has not experienced a seizure in 1.5 years) ? ?SUBJECTIVE: Patient reports things are going, he feels the exercises help.  ? ?PAIN:  ?Are you having pain? Yes:  ?NPRS scale: 5-6/10 ?Pain location: Lt lateral shoulder ?Pain description: burning, irritation ?Aggravating factors: lifting, sleep ?Relieving factors: medication ? ?PATIENT GOALS "I want to be able to use the shoulder somewhat, better than I can."  ? ? ?OBJECTIVE:  ?PATIENT SURVEYS:  ?Quick Dash 50% disability ?  ?POSTURE: ?Forward head, rounded shoulders  ?  ?UPPER EXTREMITY ROM:  ?  ?Active ROM Right ?01/25/2022 Left ?01/25/2022  ?Shoulder flexion   130 pain on descent  ?Shoulder extension      ?Shoulder abduction   110 pain  ?Shoulder adduction      ?Shoulder internal rotation   WNL  ?Shoulder external rotation   50 pain  ?(Blank rows = not tested) ?  ?UPPER EXTREMITY MMT: ?  ?MMT Right ?01/25/2022 Left ?01/25/2022 Left ?02/15/2022  ?Shoulder flexion 5/5 4-/5 4/5  ?Shoulder extension       ?Shoulder abduction 5/5 5/5   ?Shoulder adduction       ?Shoulder internal rotation 5/5 4+/5   ?Shoulder external rotation 5/5 4/5   ?Middle trapezius       ?Lower trapezius       ?(  Blank rows = not tested) ?  ?SHOULDER SPECIAL TESTS: ?           (+) Empty can (+) Neers (+) Hawkin's Kennedy  ?  ?JOINT MOBILITY TESTING:  ?Lt GHJ hypomobile all planes ?  ?PALPATION:  ?TTP greater tubercle, clavicle  ?            ? ?TODAY'S TREATMENT:  ?[facility] Adult PT Treatment:                                                DATE: 02/15/2022 ?Therapeutic Exercise: ?UBE L1 x 4 min (2 fwd/bwd) while taking subejctive ?Supine dowel press with overhead flexion 2# 2 x 5 ?Supine horizontal abduction with red 2 x 10 ?Sidelying ER with 3# 2 x 10 ?Sidelying abduction with 3# 2 x 10 ?Row with green 2 x 10 ?Standing shoulder IR with green 2 x 10 ?Standing scaption 2 x 10 ?  ?  ?PATIENT EDUCATION: ?Education details: HEP update ?Person  educated: Patient ?Education method: Explanation, Demonstration, Tactile cues, Verbal cues, and Handouts ?Education comprehension: verbalized understanding, returned demonstration, verbal cues required, tactile cues required, and needs further education ?  ?HOME EXERCISE PROGRAM: ?Access Code: [email or web address] ?  ?  ?ASSESSMENT: ?CLINICAL IMPRESSION: ?Patient tolerated therapy well with no adverse effects. Therapy focused primarily on progression of shoulder motion and strength with good tolerance. He does report pain mainly with shoulder elevation at end range. Updated HEP to progress strength. He will benefit from skilled PT to address his Lt shoulder pain, ROM impairments, strength deficits, and postural dysfunction in order to optimize his function.  ?  ?  ?OBJECTIVE IMPAIRMENTS decreased ROM, decreased strength, hypomobility, impaired UE functional use, postural dysfunction, and pain.  ?  ?ACTIVITY LIMITATIONS cleaning, driving, yard work, and shopping.  ?  ?PERSONAL FACTORS Age, Profession, Time since onset of injury/illness/exacerbation, and 3+ comorbidities: see PMH and surgical history above  are also affecting patient's functional outcome.  ?  ?  ?GOALS: ?Goals reviewed with patient? No ?  ?SHORT TERM GOALS: Target date: 02/15/2022 ?  ?Patient will be independent and compliant with initial HEP.  ?Baseline: issued at eval ?02/15/2022: progression ?Goal status: ONGOING ?  ?2.  Patient will demonstrate knowledge and application of appropriate sitting posture.  ?Baseline: forward head; rounded shoulders  ?02/15/2022: still needs cues ?Goal status: ONGOING ?  ?  ?  ?LONG TERM GOALS: Target date: 03/08/2022 ?  ?Patient will demonstrate at least 150 degrees of Lt shoulder flexion and abduction AROM to improve ability to complete overhead reaching.  ?Baseline: see above  ?Goal status: INITIAL ?  ?2.  Patient will demonstrate at least 70 degrees of Lt shoulder ER AROM to improve ability to complete self-care activities.   ?Baseline: see above ?Goal status: INITIAL ?  ?3.  Patient will demonstrate at least 4+/5 Lt shoulder strength to improve GHJ stability necessary for lifting and carrying activities.  ?Baseline: see above  ?Goal status: INITIAL ?  ?3.  Patient will score </=35% disability on QuickDASH to signify clinically meaningful improvement in functional abilities.  ?Baseline: see above  ?Goal status: INITIAL ?  ?  ?PLAN: ?PT FREQUENCY: 2x/week ?  ?PT DURATION: 6 weeks ?  ?PLANNED INTERVENTIONS: Therapeutic exercises, Therapeutic activity, Neuromuscular re-education, Patient/Family education, Joint mobilization, Dry Needling, Electrical stimulation, Cryotherapy, Moist heat, Taping, Vasopneumatic device, Ultrasound, Ionotophoresis 4mg /ml Dexamethasone,  and Manual therapy ?  ?PLAN FOR NEXT SESSION: review HEP, progress shoulder ROM and strength as tolerated; consider ionto  ? ? ? ?Rosana Hoes, PT, DPT, LAT, ATC ?02/15/22  3:26 PM ?Phone: [phone] ?Fax: [phone] ? ? ?  ? ?

## 2022-02-15 ENCOUNTER — Other Ambulatory Visit: Payer: Self-pay

## 2022-02-15 ENCOUNTER — Encounter: Payer: Self-pay | Admitting: Physical Therapy

## 2022-02-15 ENCOUNTER — Ambulatory Visit: Payer: 59 | Admitting: Physical Therapy

## 2022-02-15 DIAGNOSIS — G8929 Other chronic pain: Secondary | ICD-10-CM

## 2022-02-15 DIAGNOSIS — M6281 Muscle weakness (generalized): Secondary | ICD-10-CM

## 2022-02-15 DIAGNOSIS — R293 Abnormal posture: Secondary | ICD-10-CM | POA: Diagnosis present

## 2022-02-15 DIAGNOSIS — M25512 Pain in left shoulder: Secondary | ICD-10-CM | POA: Diagnosis present

## 2022-02-15 NOTE — Patient Instructions (Signed)
Access Code: [email or web address] ?URL: [email or web address] ?Date: 02/15/2022 ?Prepared by: Rosana Hoes ? ?Exercises ?- Doorway Pec Stretch at 60 Degrees Abduction with Arm Straight  - 1 x daily - 7 x weekly - 3 sets - 30 sec  hold ?- Supine Shoulder Horizontal Abduction with Resistance  - 1 x daily - 7 x weekly - 2 sets - 10 reps ?- Sidelying Shoulder ER with Towel and Dumbbell  - 1 x daily - 7 x weekly - 2 sets - 10 reps ?- Sidelying Shoulder Abduction Palm Forward  - 1 x daily - 7 x weekly - 2 sets - 10 reps ?- Banded Row  - 1 x daily - 7 x weekly - 2 sets - 10 reps ?- Shoulder Internal Rotation with Resistance  - 1 x daily - 7 x weekly - 2 sets - 10 reps ?- Standing Shoulder Scaption  - 1 x daily - 7 x weekly - 2 sets - 10 reps ?

## 2022-02-17 ENCOUNTER — Ambulatory Visit: Payer: 59 | Admitting: Physical Therapy

## 2022-02-17 ENCOUNTER — Encounter: Payer: Self-pay | Admitting: Physical Therapy

## 2022-02-17 ENCOUNTER — Other Ambulatory Visit: Payer: Self-pay

## 2022-02-17 DIAGNOSIS — M6281 Muscle weakness (generalized): Secondary | ICD-10-CM

## 2022-02-17 DIAGNOSIS — M25512 Pain in left shoulder: Secondary | ICD-10-CM | POA: Diagnosis not present

## 2022-02-17 DIAGNOSIS — R293 Abnormal posture: Secondary | ICD-10-CM

## 2022-02-17 DIAGNOSIS — G8929 Other chronic pain: Secondary | ICD-10-CM

## 2022-02-17 NOTE — Patient Instructions (Signed)
Access Code: [email or web address] ?URL: [email or web address] ?Date: 02/17/2022 ?Prepared by: Rosana Hoes ? ?Exercises ?- Doorway Pec Stretch at 60 Degrees Abduction with Arm Straight  - 1 x daily - 7 x weekly - 3 sets - 30 sec  hold ?- Supine Shoulder Horizontal Abduction with Resistance  - 1 x daily - 7 x weekly - 2 sets - 10 reps ?- Sidelying Shoulder ER with Towel and Dumbbell  - 1 x daily - 7 x weekly - 2 sets - 10 reps ?- Sidelying Shoulder Abduction Palm Forward  - 1 x daily - 7 x weekly - 2 sets - 10 reps ?- Banded Row  - 1 x daily - 7 x weekly - 2 sets - 10 reps ?- Shoulder External Rotation with Anchored Resistance  - 1 x daily - 7 x weekly - 2 sets - 10 reps ?- Shoulder Internal Rotation with Resistance  - 1 x daily - 7 x weekly - 2 sets - 10 reps ?- Standing Shoulder Scaption  - 1 x daily - 7 x weekly - 2 sets - 10 reps ?

## 2022-02-17 NOTE — Therapy (Signed)
?OUTPATIENT PHYSICAL THERAPY TREATMENT NOTE ? ? ?Patient Name: Shawn Potts ?MRN: [id number] ?DOB:[birth year], 56 y.o., male ?Today's Date: 02/17/2022 ? ?PCP: Deatra James, MD ?REFERRING PROVIDER: Tarry Kos, MD ? ? PT End of Session - 02/17/22 1448   ? ? Visit Number 3   ? Number of Visits 13   ? Date for PT Re-Evaluation 03/12/22   ? Authorization Type MCR / MCD   ? PT Start Time 1445   ? PT Stop Time 1525   ? PT Time Calculation (min) 40 min   ? Activity Tolerance Patient tolerated treatment well   ? Behavior During Therapy [facility] for tasks assessed/performed   ? ?  ?  ? ?  ? ? ? ?Past Medical History:  ?Diagnosis Date  ? Asthma   ? bronchial asthma,   ? High cholesterol   ? Hypertension   ? Seizures (HCC) last seizure 09-2019  ? Umbilical hernia   ? ?Past Surgical History:  ?Procedure Laterality Date  ? BRAIN SURGERY  x 4, lasy 2005 or 2006  ? BRAIN SURGERY    ? SHOULDER SURGERY Right yrs ago  ? SPINAL FIXATION SURGERY  yrs ago  ? cervical fusion  ? surgery on head  age 68  ? fell thru glass window, neck seriously injured  ? UMBILICAL HERNIA REPAIR N/A 09/18/2020  ? Procedure: OPEN UMBILICAL HERNIA REPAIR;  Surgeon: Berna Bue, MD;  Location: [facility] [place];  Service: General;  Laterality: N/A;  ? ?Patient Active Problem List  ? Diagnosis Date Noted  ? Traumatic tear of supraspinatus tendon of left shoulder 08/10/2021  ? Impingement syndrome of left shoulder 08/10/2021  ? 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 Pulmonary emphysema (HCC) 07/22/2021  ? Chest pain 02/06/2012  ? HTN (hypertension) 02/06/2012  ? Seizure disorder (HCC) 02/06/2012  ? Tobacco abuse 02/06/2012  ? ? ?REFERRING PROVIDER: Tarry Kos, MD ?  ?REFERRING DIAG: Impingement syndrome of left shoulder  ? ?THERAPY DIAG:  ?Chronic left shoulder pain ? ?Muscle weakness (generalized) ? ?Abnormal posture ? ?PERTINENT HISTORY: Previous traumatic cervical injury as a teenager (fell through glass window), Per patient  Cervical fusion 2007 ? ?PRECAUTIONS: Fall risk due to epilepsy (has not experienced a seizure in 1.5 years) ? ?SUBJECTIVE: Patient reports this shoulder feels pretty good when he is active but will hurt at night, and he will get stiff if he leaves his left shoulder in one position while driving. ? ?PAIN:  ?Are you having pain? Yes:  ?NPRS scale: 5/10 ?Pain location: Lt lateral shoulder ?Pain description: burning, irritation ?Aggravating factors: lifting, sleep ?Relieving factors: medication ? ?PATIENT GOALS "I want to be able to use the shoulder somewhat, better than I can."  ? ? ?OBJECTIVE:  ?PATIENT SURVEYS:  ?Quick Dash 50% disability ?  ?POSTURE: ?Forward head, rounded shoulders  ?  ?UPPER EXTREMITY ROM:  ?  ?Active ROM Right ?01/25/2022 Left ?01/25/2022  ?Shoulder flexion   130 pain on descent  ?Shoulder extension      ?Shoulder abduction   110 pain  ?Shoulder adduction      ?Shoulder internal rotation   WNL  ?Shoulder external rotation   50 pain  ?(Blank rows = not tested) ?  ?UPPER EXTREMITY MMT: ?  ?MMT Right ?01/25/2022 Left ?01/25/2022 Left ?02/15/2022  ?Shoulder flexion 5/5 4-/5 4/5  ?Shoulder extension       ?Shoulder abduction 5/5 5/5   ?Shoulder adduction       ?Shoulder internal rotation 5/5  4+/5   ?Shoulder external rotation 5/5 4/5   ?Middle trapezius       ?Lower trapezius       ?(Blank rows = not tested) ?  ?SHOULDER SPECIAL TESTS: ?           (+) Empty can (+) Neers (+) Hawkin's Kennedy  ?  ?JOINT MOBILITY TESTING:  ?Lt GHJ hypomobile all planes ?  ?PALPATION:  ?TTP greater tubercle, clavicle  ?            ? ?[facility] TREATMENT[facility]  [facility]                                                DATE: [facility]17[facility] ?Therapeutic Exercise: ?UBE L1 x 4 min (2 fwd/bwd) while taking subejctive ?Supine dowel press with overhead flexion 2# 2 x 5 ?Supine horizontal abduction with red 2 x 10 ?Reclined shoulder flexion with 2# 2 x 10 ?Sidelying abduction with 3# 2 x 10 ?Standing shoulder ER with yellow 2 x  10 ?Row with green 2 x 10 ?Standing shoulder IR with green 2 x 10 ?Extension and scap retraction with green 2 x 10 ?Standing scaption 2 x 10 ?Wall slide shoulder flexion 2 x 10 ? ? ?[facility]                                                DATE: 02/15/2022 ?Therapeutic Exercise: ?UBE L1 x 4 min (2 fwd/bwd) while taking subejctive ?Supine dowel press with overhead flexion 2# 2 x 5 ?Supine horizontal abduction with red 2 x 10 ?Sidelying ER with 3# 2 x 10 ?Sidelying abduction with 3# 2 x 10 ?Row with green 2 x 10 ?Standing shoulder IR with green 2 x 10 ?Standing scaption 2 x 10 ?   ?PATIENT EDUCATION: ?Education details: HEP ?Person educated: Patient ?Education method: Explanation, Demonstration, Tactile cues, Verbal cues, and Handouts ?Education comprehension: verbalized understanding, returned demonstration, verbal cues required, tactile cues required, and needs further education ?  ?HOME EXERCISE PROGRAM: ?Access Code: [email or web address] ?  ?  ?ASSESSMENT: ?CLINICAL IMPRESSION: ?Patient tolerated therapy well with no adverse effects. Therapy continues to focus on strengthening for the right shoulder. He seems to be progressing well and tolerating higher resistance and weight. He does report muscle burn and fatigue with exercise. No changes to his HEP this visit. He will benefit from skilled PT to address his left shoulder pain, ROM impairments, strength deficits, and postural dysfunction in order to reduce pain and optimize his function. ?  ?  ?OBJECTIVE IMPAIRMENTS decreased ROM, decreased strength, hypomobility, impaired UE functional use, postural dysfunction, and pain.  ?  ?ACTIVITY LIMITATIONS cleaning, driving, yard work, and shopping.  ?  ?PERSONAL FACTORS Age, Profession, Time since onset of injury/illness/exacerbation, and 3+ comorbidities: see PMH and surgical history above  are also affecting patient's functional outcome.  ?  ?  ?GOALS: ?SHORT TERM GOALS: Target date: 02/15/2022 ?  ?Patient will be  independent and compliant with initial HEP.  ?Baseline: issued at eval ?02/15/2022: progression ?Goal status: ONGOING ?  ?2.  Patient will demonstrate knowledge and application of appropriate sitting posture.  ?Baseline: forward head; rounded shoulders  ?02/15/2022: still needs cues ?Goal status: ONGOING ?   ?  LONG TERM GOALS: Target date: 03/08/2022 ?  ?Patient will demonstrate at least 150 degrees of Lt shoulder flexion and abduction AROM to improve ability to complete overhead reaching.  ?Baseline: see above  ?Goal status: INITIAL ?  ?2.  Patient will demonstrate at least 70 degrees of Lt shoulder ER AROM to improve ability to complete self-care activities.  ?Baseline: see above ?Goal status: INITIAL ?  ?3.  Patient will demonstrate at least 4+/5 Lt shoulder strength to improve GHJ stability necessary for lifting and carrying activities.  ?Baseline: see above  ?Goal status: INITIAL ?  ?3.  Patient will score </=35% disability on QuickDASH to signify clinically meaningful improvement in functional abilities.  ?Baseline: see above  ?Goal status: INITIAL ?  ?  ?PLAN: ?PT FREQUENCY: 2x/week ?  ?PT DURATION: 6 weeks ?  ?PLANNED INTERVENTIONS: Therapeutic exercises, Therapeutic activity, Neuromuscular re-education, Patient/Family education, Joint mobilization, Dry Needling, Electrical stimulation, Cryotherapy, Moist heat, Taping, Vasopneumatic device, Ultrasound, Ionotophoresis 4mg /ml Dexamethasone, and Manual therapy ?  ?PLAN FOR NEXT SESSION: review HEP, progress shoulder ROM and strength as tolerated; consider ionto  ? ? ? ? , PT, DPT, LAT, ATC ?02/17/22  3:30 PM ?Phone: [phone] ?Fax: [phone] ? ? ?  ? ?

## 2022-02-21 NOTE — Therapy (Incomplete)
?OUTPATIENT PHYSICAL THERAPY TREATMENT NOTE ? ? ?Patient Name: Shawn Potts ?MRN: [id number] ?DOB:11-18-1965, 56 y.o., male ?Today's Date: 02/21/2022 ? ?PCP: Deatra James, MD ?REFERRING PROVIDER: Deatra James, MD ? ? ? ? ? ?Past Medical History:  ?Diagnosis Date  ? Asthma   ? bronchial asthma,   ? High cholesterol   ? Hypertension   ? Seizures (HCC) last seizure 09-2019  ? Umbilical hernia   ? ?Past Surgical History:  ?Procedure Laterality Date  ? BRAIN SURGERY  x 4, lasy 2005 or 2006  ? BRAIN SURGERY    ? SHOULDER SURGERY Right yrs ago  ? SPINAL FIXATION SURGERY  yrs ago  ? cervical fusion  ? surgery on head  age 73  ? fell thru glass window, neck seriously injured  ? UMBILICAL HERNIA REPAIR N/A 09/18/2020  ? Procedure: OPEN UMBILICAL HERNIA REPAIR;  Surgeon: Berna Bue, MD;  Location: [facility] [place];  Service: General;  Laterality: N/A;  ? ?Patient Active Problem List  ? Diagnosis Date Noted  ? Traumatic tear of supraspinatus tendon of left shoulder 08/10/2021  ? Impingement syndrome of left shoulder 08/10/2021  ? 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 Pulmonary emphysema (HCC) 07/22/2021  ? Chest pain 02/06/2012  ? HTN (hypertension) 02/06/2012  ? Seizure disorder (HCC) 02/06/2012  ? Tobacco abuse 02/06/2012  ? ? ?REFERRING PROVIDER: Tarry Kos, MD ?  ?REFERRING DIAG: Impingement syndrome of left shoulder  ? ?THERAPY DIAG:  ?No diagnosis found. ? ?PERTINENT HISTORY: Previous traumatic cervical injury as a teenager (fell through glass window), Per patient Cervical fusion 2007 ? ?PRECAUTIONS: Fall risk due to epilepsy (has not experienced a seizure in 1.5 years) ? ?SUBJECTIVE: Patient reports this shoulder feels pretty good when he is active but will hurt at night, and he will get stiff if he leaves his left shoulder in one position while driving. ? ?PAIN:  ?Are you having pain? Yes:  ?NPRS scale: 5/10 ?Pain location: Lt lateral shoulder ?Pain description: burning,  irritation ?Aggravating factors: lifting, sleep ?Relieving factors: medication ? ?PATIENT GOALS "I want to be able to use the shoulder somewhat, better than I can."  ? ? ?OBJECTIVE:  ?PATIENT SURVEYS:  ?Quick Dash 50% disability ?  ?POSTURE: ?Forward head, rounded shoulders  ?  ?UPPER EXTREMITY ROM:  ?  ?Active ROM Right ?01/25/2022 Left ?01/25/2022  ?Shoulder flexion   130 pain on descent  ?Shoulder extension      ?Shoulder abduction   110 pain  ?Shoulder adduction      ?Shoulder internal rotation   WNL  ?Shoulder external rotation   50 pain  ?(Blank rows = not tested) ?  ?UPPER EXTREMITY MMT: ?  ?MMT Right ?01/25/2022 Left ?01/25/2022 Left ?02/15/2022  ?Shoulder flexion 5/5 4-/5 4/5  ?Shoulder extension       ?Shoulder abduction 5/5 5/5   ?Shoulder adduction       ?Shoulder internal rotation 5/5 4+/5   ?Shoulder external rotation 5/5 4/5   ?Middle trapezius       ?Lower trapezius       ?(Blank rows = not tested) ?  ?SHOULDER SPECIAL TESTS: ?           (+) Empty can (+) Neers (+) Hawkin's Kennedy  ?  ?JOINT MOBILITY TESTING:  ?Lt GHJ hypomobile all planes ?  ?PALPATION:  ?TTP greater tubercle, clavicle  ?            ? ?TODAY'S TREATMENT:  ?[facility] Adult PT Treatment:  DATE: 02/22/2022 ?Therapeutic Exercise: ?UBE L1 x 4 min (2 fwd/bwd) while taking subejctive ?Supine dowel press with overhead flexion 2# 2 x 5 ?Supine horizontal abduction with red 2 x 10 ?Reclined shoulder flexion with 2# 2 x 10 ?Sidelying abduction with 3# 2 x 10 ?Standing shoulder ER with yellow 2 x 10 ?Row with green 2 x 10 ?Standing shoulder IR with green 2 x 10 ?Extension and scap retraction with green 2 x 10 ?Standing scaption 2 x 10 ?Wall slide shoulder flexion 2 x 10 ? ? ?[facility] Adult PT Treatment:                                                DATE: 02/17/2022 ?Therapeutic Exercise: ?UBE L1 x 4 min (2 fwd/bwd) while taking subejctive ?Supine dowel press with overhead flexion 2# 2 x 5 ?Supine horizontal  abduction with red 2 x 10 ?Reclined shoulder flexion with 2# 2 x 10 ?Sidelying abduction with 3# 2 x 10 ?Standing shoulder ER with yellow 2 x 10 ?Row with green 2 x 10 ?Standing shoulder IR with green 2 x 10 ?Extension and scap retraction with green 2 x 10 ?Standing scaption 2 x 10 ?Wall slide shoulder flexion 2 x 10 ? ?[facility] Adult PT Treatment:                                                DATE: 02/15/2022 ?Therapeutic Exercise: ?UBE L1 x 4 min (2 fwd/bwd) while taking subejctive ?Supine dowel press with overhead flexion 2# 2 x 5 ?Supine horizontal abduction with red 2 x 10 ?Sidelying ER with 3# 2 x 10 ?Sidelying abduction with 3# 2 x 10 ?Row with green 2 x 10 ?Standing shoulder IR with green 2 x 10 ?Standing scaption 2 x 10 ?   ?PATIENT EDUCATION: ?Education details: HEP ?Person educated: Patient ?Education method: Explanation, Demonstration, Tactile cues, Verbal cues, and Handouts ?Education comprehension: verbalized understanding, returned demonstration, verbal cues required, tactile cues required, and needs further education ?  ?HOME EXERCISE PROGRAM: ?Access Code: [email or web address] ?  ?  ?ASSESSMENT: ?CLINICAL IMPRESSION: ?Patient tolerated therapy well with no adverse effects. *** He will benefit from skilled PT to address his left shoulder pain, ROM impairments, strength deficits, and postural dysfunction in order to reduce pain and optimize his function. ? ?Therapy continues to focus on strengthening for the right shoulder. He seems to be progressing well and tolerating higher resistance and weight. He does report muscle burn and fatigue with exercise. No changes to his HEP this visit.  ?  ?  ?OBJECTIVE IMPAIRMENTS decreased ROM, decreased strength, hypomobility, impaired UE functional use, postural dysfunction, and pain.  ?  ?ACTIVITY LIMITATIONS cleaning, driving, yard work, and shopping.  ?  ?PERSONAL FACTORS Age, Profession, Time since onset of injury/illness/exacerbation, and 3+ comorbidities: see PMH and  surgical history above  are also affecting patient's functional outcome.  ?  ?  ?GOALS: ?SHORT TERM GOALS: Target date: 02/15/2022 ?  ?Patient will be independent and compliant with initial HEP.  ?Baseline: issued at eval ?02/15/2022: progression ?Goal status: ONGOING ?  ?2.  Patient will demonstrate knowledge and application of appropriate sitting posture.  ?Baseline: forward head; rounded shoulders  ?02/15/2022: still needs cues ?Goal status: ONGOING ?   ?  LONG TERM GOALS: Target date: 03/08/2022 ?  ?Patient will demonstrate at least 150 degrees of Lt shoulder flexion and abduction AROM to improve ability to complete overhead reaching.  ?Baseline: see above  ?Goal status: INITIAL ?  ?2.  Patient will demonstrate at least 70 degrees of Lt shoulder ER AROM to improve ability to complete self-care activities.  ?Baseline: see above ?Goal status: INITIAL ?  ?3.  Patient will demonstrate at least 4+/5 Lt shoulder strength to improve GHJ stability necessary for lifting and carrying activities.  ?Baseline: see above  ?Goal status: INITIAL ?  ?3.  Patient will score </=35% disability on QuickDASH to signify clinically meaningful improvement in functional abilities.  ?Baseline: see above  ?Goal status: INITIAL ?  ?  ?PLAN: ?PT FREQUENCY: 2x/week ?  ?PT DURATION: 6 weeks ?  ?PLANNED INTERVENTIONS: Therapeutic exercises, Therapeutic activity, Neuromuscular re-education, Patient/Family education, Joint mobilization, Dry Needling, Electrical stimulation, Cryotherapy, Moist heat, Taping, Vasopneumatic device, Ultrasound, Ionotophoresis 4mg /ml Dexamethasone, and Manual therapy ?  ?PLAN FOR NEXT SESSION: review HEP, progress shoulder ROM and strength as tolerated; consider ionto  ? ? ? ?Rosana Hoesampbell Rosario Duey, PT, DPT, LAT, ATC ?02/21/22  1:33 PM ?Phone: [phone] ?Fax: [phone] ? ? ?  ? ?

## 2022-02-22 ENCOUNTER — Ambulatory Visit: Payer: 59 | Admitting: Physical Therapy

## 2022-02-23 ENCOUNTER — Inpatient Hospital Stay: Admission: RE | Admit: 2022-02-23 | Payer: 59 | Source: Ambulatory Visit

## 2022-02-23 NOTE — Therapy (Signed)
?OUTPATIENT PHYSICAL THERAPY TREATMENT NOTE ? ? ?Patient Name: Shawn Potts ?MRN: [id number] ?DOB:[birth year], 56 y.o., male ?Today's Date: 02/24/2022 ? ?PCP: Deatra James, MD ?REFERRING PROVIDER: Tarry Kos, MD ? ? PT End of Session - 02/24/22 1454   ? ? Visit Number 4   ? Number of Visits 13   ? Date for PT Re-Evaluation 03/12/22   ? Authorization Type MCR / MCD   ? PT Start Time 1450   ? PT Stop Time 1530   ? PT Time Calculation (min) 40 min   ? Activity Tolerance Patient tolerated treatment well   ? Behavior During Therapy [facility] for tasks assessed/performed   ? ?  ?  ? ?  ? ? ? ? ?Past Medical History:  ?Diagnosis Date  ? Asthma   ? bronchial asthma,   ? High cholesterol   ? Hypertension   ? Seizures (HCC) last seizure 09-2019  ? Umbilical hernia   ? ?Past Surgical History:  ?Procedure Laterality Date  ? BRAIN SURGERY  x 4, lasy 2005 or 2006  ? BRAIN SURGERY    ? SHOULDER SURGERY Right yrs ago  ? SPINAL FIXATION SURGERY  yrs ago  ? cervical fusion  ? surgery on head  age 26  ? fell thru glass window, neck seriously injured  ? UMBILICAL HERNIA REPAIR N/A 09/18/2020  ? Procedure: OPEN UMBILICAL HERNIA REPAIR;  Surgeon: Berna Bue, MD;  Location: [facility] [place];  Service: General;  Laterality: N/A;  ? ?Patient Active Problem List  ? Diagnosis Date Noted  ? Traumatic tear of supraspinatus tendon of left shoulder 08/10/2021  ? Impingement syndrome of left shoulder 08/10/2021  ? 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 Pulmonary emphysema (HCC) 07/22/2021  ? Chest pain 02/06/2012  ? HTN (hypertension) 02/06/2012  ? Seizure disorder (HCC) 02/06/2012  ? Tobacco abuse 02/06/2012  ? ? ?REFERRING PROVIDER: Tarry Kos, MD ?  ?REFERRING DIAG: Impingement syndrome of left shoulder  ? ?THERAPY DIAG:  ?Chronic left shoulder pain ? ?Muscle weakness (generalized) ? ?Abnormal posture ? ?PERTINENT HISTORY: Previous traumatic cervical injury as a teenager (fell through glass window), Per patient  Cervical fusion 2007 ? ?PRECAUTIONS: Fall risk due to epilepsy (has not experienced a seizure in 1.5 years) ? ?SUBJECTIVE: Patient reports shoulder is feeling alright. He remains consistent with his exercises. ? ?PAIN:  ?Are you having pain? Yes:  ?NPRS scale: 5/10 ?Pain location: Lt lateral shoulder ?Pain description: burning, irritation ?Aggravating factors: lifting, sleep ?Relieving factors: medication ? ?PATIENT GOALS "I want to be able to use the shoulder somewhat, better than I can."  ? ? ?OBJECTIVE:  ?PATIENT SURVEYS:  ?Quick Dash 50% disability ?  ?POSTURE: ?Forward head, rounded shoulders  ?  ?UPPER EXTREMITY ROM:  ?  ?Active ROM Right ?01/25/2022 Left ?01/25/2022  ?Shoulder flexion   130 pain on descent  ?Shoulder extension      ?Shoulder abduction   110 pain  ?Shoulder adduction      ?Shoulder internal rotation   WNL  ?Shoulder external rotation   50 pain  ?(Blank rows = not tested) ?  ?UPPER EXTREMITY MMT: ?  ?MMT Right ?01/25/2022 Left ?01/25/2022 Left ?02/15/2022  ?Shoulder flexion 5/5 4-/5 4/5  ?Shoulder extension       ?Shoulder abduction 5/5 5/5   ?Shoulder adduction       ?Shoulder internal rotation 5/5 4+/5   ?Shoulder external rotation 5/5 4/5   ?Middle trapezius       ?Lower  trapezius       ?(Blank rows = not tested) ?  ?SHOULDER SPECIAL TESTS: ?           (+) Empty can (+) Neers (+) Hawkin's Kennedy  ?  ?JOINT MOBILITY TESTING:  ?Lt GHJ hypomobile all planes ?  ?PALPATION:  ?TTP greater tubercle, clavicle  ?            ? ?TODAY'S TREATMENT:  ?[facility] Adult PT Treatment:                                                DATE: 02/24/2022 ?Therapeutic Exercise: ?UBE L4 x 4 min (2 fwd/bwd) while taking subejctive ?Supine shoulder flexion with physioball 2 x 10 ?Supine banded shoulder flexion 90+ deg with red 2 x 10 ?Supine horizontal abduction with red 2 x 10 ?Reclined shoulder flexion with 2# 2 x 10 ?Standing shoulder ER/IR with green 2 x 15 each ?Row with green 2 x 15 ?Extension and scap retraction with  green 2 x 15 ?Standing scaption with 5# 2 x 10 ?Tall plank on elevated table with alternating shoulder taps 2 x 20 ? ? ?[facility] Adult PT Treatment:                                                DATE: 02/17/2022 ?Therapeutic Exercise: ?UBE L1 x 4 min (2 fwd/bwd) while taking subejctive ?Supine dowel press with overhead flexion 2# 2 x 5 ?Supine horizontal abduction with red 2 x 10 ?Reclined shoulder flexion with 2# 2 x 10 ?Sidelying abduction with 3# 2 x 10 ?Standing shoulder ER with yellow 2 x 10 ?Row with green 2 x 10 ?Standing shoulder IR with green 2 x 10 ?Extension and scap retraction with green 2 x 10 ?Standing scaption 2 x 10 ?Wall slide shoulder flexion 2 x 10 ? ?[facility] Adult PT Treatment:                                                DATE: 02/15/2022 ?Therapeutic Exercise: ?UBE L1 x 4 min (2 fwd/bwd) while taking subejctive ?Supine dowel press with overhead flexion 2# 2 x 5 ?Supine horizontal abduction with red 2 x 10 ?Sidelying ER with 3# 2 x 10 ?Sidelying abduction with 3# 2 x 10 ?Row with green 2 x 10 ?Standing shoulder IR with green 2 x 10 ?Standing scaption 2 x 10 ?   ?PATIENT EDUCATION: ?Education details: HEP ?Person educated: Patient ?Education method: Explanation, Demonstration, Tactile cues, Verbal cues, and Handouts ?Education comprehension: verbalized understanding, returned demonstration, verbal cues required, tactile cues required, and needs further education ?  ?HOME EXERCISE PROGRAM: ?Access Code: [email or web address] ?  ?  ?ASSESSMENT: ?CLINICAL IMPRESSION: ?Patient tolerated therapy well with no adverse effects. Therapy continues to focus primarily on strengthening for the left shoulder. He is progressing well with exercises, and able to increase weight and resistance with rotator cuff strengthening and lifting with the left arm. No changes made to HEP, he was instructed to gradually increase resistance and reps at home. He will benefit from skilled PT to address his left shoulder pain, ROM  impairments,  strength deficits, and postural dysfunction in order to reduce pain and optimize his function. ?  ?  ?OBJECTIVE IMPAIRMENTS decreased ROM, decreased strength, hypomobility, impaired UE functional use, postural dysfunction, and pain.  ?  ?ACTIVITY LIMITATIONS cleaning, driving, yard work, and shopping.  ?  ?PERSONAL FACTORS Age, Profession, Time since onset of injury/illness/exacerbation, and 3+ comorbidities: see PMH and surgical history above  are also affecting patient's functional outcome.  ?  ?  ?GOALS: ?SHORT TERM GOALS: Target date: 02/15/2022 ?  ?Patient will be independent and compliant with initial HEP.  ?Baseline: issued at eval ?02/15/2022: progression ?Goal status: ONGOING ?  ?2.  Patient will demonstrate knowledge and application of appropriate sitting posture.  ?Baseline: forward head; rounded shoulders  ?02/15/2022: still needs cues ?Goal status: ONGOING ?   ?LONG TERM GOALS: Target date: 03/08/2022 ?  ?Patient will demonstrate at least 150 degrees of Lt shoulder flexion and abduction AROM to improve ability to complete overhead reaching.  ?Baseline: see above  ?Goal status: INITIAL ?  ?2.  Patient will demonstrate at least 70 degrees of Lt shoulder ER AROM to improve ability to complete self-care activities.  ?Baseline: see above ?Goal status: INITIAL ?  ?3.  Patient will demonstrate at least 4+/5 Lt shoulder strength to improve GHJ stability necessary for lifting and carrying activities.  ?Baseline: see above  ?Goal status: INITIAL ?  ?3.  Patient will score </=35% disability on QuickDASH to signify clinically meaningful improvement in functional abilities.  ?Baseline: see above  ?Goal status: INITIAL ?  ?  ?PLAN: ?PT FREQUENCY: 2x/week ?  ?PT DURATION: 6 weeks ?  ?PLANNED INTERVENTIONS: Therapeutic exercises, Therapeutic activity, Neuromuscular re-education, Patient/Family education, Joint mobilization, Dry Needling, Electrical stimulation, Cryotherapy, Moist heat, Taping, Vasopneumatic device,  Ultrasound, Ionotophoresis 4mg /ml Dexamethasone, and Manual therapy ?  ?PLAN FOR NEXT SESSION: review HEP, progress shoulder ROM and strength as tolerated; consider ionto  ? ? ? ?Rosana Hoesampbell Lorne Winkels, PT, DPT, LAT, ATC ?0

## 2022-02-24 ENCOUNTER — Ambulatory Visit: Payer: 59 | Admitting: Physical Therapy

## 2022-02-24 ENCOUNTER — Other Ambulatory Visit: Payer: Self-pay

## 2022-02-24 ENCOUNTER — Encounter: Payer: Self-pay | Admitting: Physical Therapy

## 2022-02-24 DIAGNOSIS — M6281 Muscle weakness (generalized): Secondary | ICD-10-CM

## 2022-02-24 DIAGNOSIS — G8929 Other chronic pain: Secondary | ICD-10-CM

## 2022-02-24 DIAGNOSIS — R293 Abnormal posture: Secondary | ICD-10-CM

## 2022-02-24 DIAGNOSIS — M25512 Pain in left shoulder: Secondary | ICD-10-CM | POA: Diagnosis not present

## 2022-02-28 NOTE — Therapy (Incomplete)
?OUTPATIENT PHYSICAL THERAPY TREATMENT NOTE ? ? ?Patient Name: Shawn Potts ?MRN: [id number] ?DOB:14-Oct-1966, 56 y.o., male ?Today's Date: 02/28/2022 ? ?PCP: Donald Prose, MD ?REFERRING PROVIDER: Donald Prose, MD ? ? ? ? ? ? ?Past Medical History:  ?Diagnosis Date  ? Asthma   ? bronchial asthma,   ? High cholesterol   ? Hypertension   ? Seizures ([place]) last seizure 09-2019  ? Umbilical hernia   ? ?Past Surgical History:  ?Procedure Laterality Date  ? BRAIN SURGERY  x 4, lasy 2005 or 2006  ? BRAIN SURGERY    ? SHOULDER SURGERY Right yrs ago  ? SPINAL FIXATION SURGERY  yrs ago  ? cervical fusion  ? surgery on head  age 71  ? fell thru glass window, neck seriously injured  ? UMBILICAL HERNIA REPAIR N/A 09/18/2020  ? Procedure: OPEN UMBILICAL HERNIA REPAIR;  Surgeon: Clovis Riley, MD;  Location: [facility];  Service: General;  Laterality: N/A;  ? ?Patient Active Problem List  ? Diagnosis Date Noted  ? Traumatic tear of supraspinatus tendon of left shoulder 08/10/2021  ? Impingement syndrome of left shoulder 08/10/2021  ? 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 Pulmonary emphysema ([place]) 07/22/2021  ? Chest pain 02/06/2012  ? HTN (hypertension) 02/06/2012  ? Seizure disorder ([place]) 02/06/2012  ? Tobacco abuse 02/06/2012  ? ? ?REFERRING PROVIDER: Leandrew Koyanagi, MD ?  ?REFERRING DIAG: Impingement syndrome of left shoulder  ? ?THERAPY DIAG:  ?No diagnosis found. ? ?PERTINENT HISTORY: Previous traumatic cervical injury as a teenager (fell through glass window), Per patient Cervical fusion 2007 ? ?PRECAUTIONS: Fall risk due to epilepsy (has not experienced a seizure in 1.5 years) ? ?SUBJECTIVE: Patient reports shoulder is feeling alright. He remains consistent with his exercises. ? ?PAIN:  ?Are you having pain? Yes:  ?NPRS scale: 5/10 ?Pain location: Lt lateral shoulder ?Pain description: burning, irritation ?Aggravating factors: lifting, sleep ?Relieving factors: medication ? ?PATIENT GOALS  "I want to be able to use the shoulder somewhat, better than I can."  ? ? ?OBJECTIVE:  ?PATIENT SURVEYS:  ?Quick Dash 50% disability ?  ?POSTURE: ?Forward head, rounded shoulders  ?  ?UPPER EXTREMITY ROM:  ?  ?Active ROM Right ?01/25/2022 Left ?01/25/2022  ?Shoulder flexion   130 pain on descent  ?Shoulder extension      ?Shoulder abduction   110 pain  ?Shoulder adduction      ?Shoulder internal rotation   WNL  ?Shoulder external rotation   50 pain  ?(Blank rows = not tested) ?  ?UPPER EXTREMITY MMT: ?  ?MMT Right ?01/25/2022 Left ?01/25/2022 Left ?02/15/2022  ?Shoulder flexion 5/5 4-/5 4/5  ?Shoulder extension       ?Shoulder abduction 5/5 5/5   ?Shoulder adduction       ?Shoulder internal rotation 5/5 4+/5   ?Shoulder external rotation 5/5 4/5   ?Middle trapezius       ?Lower trapezius       ?(Blank rows = not tested) ?  ?SHOULDER SPECIAL TESTS: ?           (+) Empty can (+) Neers (+) Hawkin's Kennedy  ?  ?JOINT MOBILITY TESTING:  ?Lt GHJ hypomobile all planes ?  ?PALPATION:  ?TTP greater tubercle, clavicle  ?            ? ?TODAY'S TREATMENT:  ?[facility] Adult PT Treatment:  DATE: 03/01/2022 ?Therapeutic Exercise: ?UBE L4 x 4 min (2 fwd/bwd) while taking subejctive ?Supine shoulder flexion with physioball 2 x 10 ?Supine banded shoulder flexion 90+ deg with red 2 x 10 ?Supine horizontal abduction with red 2 x 10 ?Reclined shoulder flexion with 2# 2 x 10 ?Standing shoulder ER/IR with green 2 x 15 each ?Row with green 2 x 15 ?Extension and scap retraction with green 2 x 15 ?Standing scaption with 5# 2 x 10 ?Tall plank on elevated table with alternating shoulder taps 2 x 20 ? ? ?[facility] Adult PT Treatment:                                                DATE: 02/24/2022 ?Therapeutic Exercise: ?UBE L4 x 4 min (2 fwd/bwd) while taking subejctive ?Supine shoulder flexion with physioball 2 x 10 ?Supine banded shoulder flexion 90+ deg with red 2 x 10 ?Supine horizontal abduction with red 2 x  10 ?Reclined shoulder flexion with 2# 2 x 10 ?Standing shoulder ER/IR with green 2 x 15 each ?Row with green 2 x 15 ?Extension and scap retraction with green 2 x 15 ?Standing scaption with 5# 2 x 10 ?Tall plank on elevated table with alternating shoulder taps 2 x 20 ? ?[facility] Adult PT Treatment:                                                DATE: 02/17/2022 ?Therapeutic Exercise: ?UBE L1 x 4 min (2 fwd/bwd) while taking subejctive ?Supine dowel press with overhead flexion 2# 2 x 5 ?Supine horizontal abduction with red 2 x 10 ?Reclined shoulder flexion with 2# 2 x 10 ?Sidelying abduction with 3# 2 x 10 ?Standing shoulder ER with yellow 2 x 10 ?Row with green 2 x 10 ?Standing shoulder IR with green 2 x 10 ?Extension and scap retraction with green 2 x 10 ?Standing scaption 2 x 10 ?Wall slide shoulder flexion 2 x 10 ?   ?PATIENT EDUCATION: ?Education details: HEP ?Person educated: Patient ?Education method: Explanation, Demonstration, Tactile cues, Verbal cues ?Education comprehension: verbalized understanding, returned demonstration, verbal cues required, tactile cues required, and needs further education ?  ?HOME EXERCISE PROGRAM: ?Access Code: [id number] ?  ?  ?ASSESSMENT: ?CLINICAL IMPRESSION: ?Patient tolerated therapy well with no adverse effects. *** He will benefit from skilled PT to address his left shoulder pain, ROM impairments, strength deficits, and postural dysfunction in order to reduce pain and optimize his function. ? ?Therapy continues to focus primarily on strengthening for the left shoulder. He is progressing well with exercises, and able to increase weight and resistance with rotator cuff strengthening and lifting with the left arm. No changes made to HEP, he was instructed to gradually increase resistance and reps at home.  ?  ?  ?OBJECTIVE IMPAIRMENTS decreased ROM, decreased strength, hypomobility, impaired UE functional use, postural dysfunction, and pain.  ?  ?ACTIVITY LIMITATIONS cleaning,  driving, yard work, and shopping.  ?  ?PERSONAL FACTORS Age, Profession, Time since onset of injury/illness/exacerbation, and 3+ comorbidities: see PMH and surgical history above  are also affecting patient's functional outcome.  ?  ?  ?GOALS: ?SHORT TERM GOALS: Target date: 02/15/2022 ?  ?Patient will be independent and compliant with initial  HEP.  ?Baseline: issued at eval ?02/15/2022: progression ?Goal status: ONGOING ?  ?2.  Patient will demonstrate knowledge and application of appropriate sitting posture.  ?Baseline: forward head; rounded shoulders  ?02/15/2022: still needs cues ?Goal status: ONGOING ?   ?LONG TERM GOALS: Target date: 03/08/2022 ?  ?Patient will demonstrate at least 150 degrees of Lt shoulder flexion and abduction AROM to improve ability to complete overhead reaching.  ?Baseline: see above  ?Goal status: INITIAL ?  ?2.  Patient will demonstrate at least 70 degrees of Lt shoulder ER AROM to improve ability to complete self-care activities.  ?Baseline: see above ?Goal status: INITIAL ?  ?3.  Patient will demonstrate at least 4+/5 Lt shoulder strength to improve GHJ stability necessary for lifting and carrying activities.  ?Baseline: see above  ?Goal status: INITIAL ?  ?3.  Patient will score </=35% disability on QuickDASH to signify clinically meaningful improvement in functional abilities.  ?Baseline: see above  ?Goal status: INITIAL ?  ?  ?PLAN: ?PT FREQUENCY: 2x/week ?  ?PT DURATION: 6 weeks ?  ?PLANNED INTERVENTIONS: Therapeutic exercises, Therapeutic activity, Neuromuscular re-education, Patient/Family education, Joint mobilization, Dry Needling, Electrical stimulation, Cryotherapy, Moist heat, Taping, Vasopneumatic device, Ultrasound, Ionotophoresis 4mg /ml Dexamethasone, and Manual therapy ?  ?PLAN FOR NEXT SESSION: review HEP, progress shoulder ROM and strength as tolerated; consider ionto  ? ? ? ?Hilda Blades, PT, DPT, LAT, ATC ?02/28/22  4:33 PM ?Phone: [phone] ?Fax: [phone] ? ? ?   ? ?

## 2022-03-01 ENCOUNTER — Ambulatory Visit: Payer: 59 | Admitting: Physical Therapy

## 2022-03-02 ENCOUNTER — Telehealth: Payer: Self-pay | Admitting: Physical Therapy

## 2022-03-02 NOTE — Telephone Encounter (Signed)
Spoke with patient regarding missed PT appointment on 03/01/2022. Patient informed of missed appointment and reminded of attendance policy. If he misses any future appointments then he will be discharged from therapy. Patient expressed understanding. ? ?Rosana Hoes, PT, DPT, LAT, ATC ?03/02/22  2:14 PM ?Phone: [phone] ?Fax: [phone] ? ?

## 2022-03-02 NOTE — Therapy (Incomplete)
?OUTPATIENT PHYSICAL THERAPY TREATMENT NOTE ? ? ?Patient Name: Shawn Potts ?MRN: [id number] ?DOB:Mar 10, 1966, 56 y.o., male ?Today's Date: 03/02/2022 ? ?PCP: Donald Prose, MD ?REFERRING PROVIDER: Leandrew Koyanagi, MD ? ? ? ? ? ? ?Past Medical History:  ?Diagnosis Date  ? Asthma   ? bronchial asthma,   ? High cholesterol   ? Hypertension   ? Seizures ([place]) last seizure 09-2019  ? Umbilical hernia   ? ?Past Surgical History:  ?Procedure Laterality Date  ? BRAIN SURGERY  x 4, lasy 2005 or 2006  ? BRAIN SURGERY    ? SHOULDER SURGERY Right yrs ago  ? SPINAL FIXATION SURGERY  yrs ago  ? cervical fusion  ? surgery on head  age 16  ? fell thru glass window, neck seriously injured  ? UMBILICAL HERNIA REPAIR N/A 09/18/2020  ? Procedure: OPEN UMBILICAL HERNIA REPAIR;  Surgeon: Clovis Riley, MD;  Location: [facility];  Service: General;  Laterality: N/A;  ? ?Patient Active Problem List  ? Diagnosis Date Noted  ? Traumatic tear of supraspinatus tendon of left shoulder 08/10/2021  ? Impingement syndrome of left shoulder 08/10/2021  ? 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 Pulmonary emphysema ([place]) 07/22/2021  ? Chest pain 02/06/2012  ? HTN (hypertension) 02/06/2012  ? Seizure disorder ([place]) 02/06/2012  ? Tobacco abuse 02/06/2012  ? ? ?REFERRING PROVIDER: Leandrew Koyanagi, MD ?  ?REFERRING DIAG: Impingement syndrome of left shoulder  ? ?THERAPY DIAG:  ?No diagnosis found. ? ?PERTINENT HISTORY: Previous traumatic cervical injury as a teenager (fell through glass window), Per patient Cervical fusion 2007 ? ?PRECAUTIONS: Fall risk due to epilepsy (has not experienced a seizure in 1.5 years) ? ?SUBJECTIVE: Patient reports shoulder is feeling alright. He remains consistent with his exercises. ? ?PAIN:  ?Are you having pain? Yes:  ?NPRS scale: 5/10 ?Pain location: Lt lateral shoulder ?Pain description: burning, irritation ?Aggravating factors: lifting, sleep ?Relieving factors: medication ? ?PATIENT  GOALS "I want to be able to use the shoulder somewhat, better than I can."  ? ? ?OBJECTIVE:  ?PATIENT SURVEYS:  ?Quick Dash 50% disability ?  ?POSTURE: ?Forward head, rounded shoulders  ?  ?UPPER EXTREMITY ROM:  ?  ?Active ROM Right ?01/25/2022 Left ?01/25/2022  ?Shoulder flexion   130 pain on descent  ?Shoulder extension      ?Shoulder abduction   110 pain  ?Shoulder adduction      ?Shoulder internal rotation   WNL  ?Shoulder external rotation   50 pain  ?(Blank rows = not tested) ?  ?UPPER EXTREMITY MMT: ?  ?MMT Right ?01/25/2022 Left ?01/25/2022 Left ?02/15/2022  ?Shoulder flexion 5/5 4-/5 4/5  ?Shoulder extension       ?Shoulder abduction 5/5 5/5   ?Shoulder adduction       ?Shoulder internal rotation 5/5 4+/5   ?Shoulder external rotation 5/5 4/5   ?Middle trapezius       ?Lower trapezius       ?(Blank rows = not tested) ?  ?SHOULDER SPECIAL TESTS: ?           (+) Empty can (+) Neers (+) Hawkin's Kennedy  ?  ?JOINT MOBILITY TESTING:  ?Lt GHJ hypomobile all planes ?  ?PALPATION:  ?TTP greater tubercle, clavicle  ?            ? ?TODAY'S TREATMENT:  ?[facility] Adult PT Treatment:  DATE: 03/03/2022 ?Therapeutic Exercise: ?UBE L4 x 4 min (2 fwd/bwd) while taking subejctive ?Supine shoulder flexion with physioball 2 x 10 ?Supine banded shoulder flexion 90+ deg with red 2 x 10 ?Supine horizontal abduction with red 2 x 10 ?Reclined shoulder flexion with 2# 2 x 10 ?Standing shoulder ER/IR with green 2 x 15 each ?Row with green 2 x 15 ?Extension and scap retraction with green 2 x 15 ?Standing scaption with 5# 2 x 10 ?Tall plank on elevated table with alternating shoulder taps 2 x 20 ? ? ?[facility] Adult PT Treatment:                                                DATE: 02/24/2022 ?Therapeutic Exercise: ?UBE L4 x 4 min (2 fwd/bwd) while taking subejctive ?Supine shoulder flexion with physioball 2 x 10 ?Supine banded shoulder flexion 90+ deg with red 2 x 10 ?Supine horizontal abduction with red  2 x 10 ?Reclined shoulder flexion with 2# 2 x 10 ?Standing shoulder ER/IR with green 2 x 15 each ?Row with green 2 x 15 ?Extension and scap retraction with green 2 x 15 ?Standing scaption with 5# 2 x 10 ?Tall plank on elevated table with alternating shoulder taps 2 x 20 ? ?[facility] Adult PT Treatment:                                                DATE: 02/17/2022 ?Therapeutic Exercise: ?UBE L1 x 4 min (2 fwd/bwd) while taking subejctive ?Supine dowel press with overhead flexion 2# 2 x 5 ?Supine horizontal abduction with red 2 x 10 ?Reclined shoulder flexion with 2# 2 x 10 ?Sidelying abduction with 3# 2 x 10 ?Standing shoulder ER with yellow 2 x 10 ?Row with green 2 x 10 ?Standing shoulder IR with green 2 x 10 ?Extension and scap retraction with green 2 x 10 ?Standing scaption 2 x 10 ?Wall slide shoulder flexion 2 x 10 ?   ?PATIENT EDUCATION: ?Education details: HEP ?Person educated: Patient ?Education method: Explanation, Demonstration, Tactile cues, Verbal cues ?Education comprehension: verbalized understanding, returned demonstration, verbal cues required, tactile cues required, and needs further education ?  ?HOME EXERCISE PROGRAM: ?Access Code: [id number] ?  ?  ?ASSESSMENT: ?CLINICAL IMPRESSION: ?Patient tolerated therapy well with no adverse effects. *** He will benefit from skilled PT to address his left shoulder pain, ROM impairments, strength deficits, and postural dysfunction in order to reduce pain and optimize his function. ? ?Therapy continues to focus primarily on strengthening for the left shoulder. He is progressing well with exercises, and able to increase weight and resistance with rotator cuff strengthening and lifting with the left arm. No changes made to HEP, he was instructed to gradually increase resistance and reps at home.  ?  ?  ?OBJECTIVE IMPAIRMENTS decreased ROM, decreased strength, hypomobility, impaired UE functional use, postural dysfunction, and pain.  ?  ?ACTIVITY LIMITATIONS cleaning,  driving, yard work, and shopping.  ?  ?PERSONAL FACTORS Age, Profession, Time since onset of injury/illness/exacerbation, and 3+ comorbidities: see PMH and surgical history above  are also affecting patient's functional outcome.  ?  ?  ?GOALS: ?SHORT TERM GOALS: Target date: 02/15/2022 ?  ?Patient will be independent and compliant with initial  HEP.  ?Baseline: issued at eval ?02/15/2022: progression ?Goal status: ONGOING ?  ?2.  Patient will demonstrate knowledge and application of appropriate sitting posture.  ?Baseline: forward head; rounded shoulders  ?02/15/2022: still needs cues ?Goal status: ONGOING ?   ?LONG TERM GOALS: Target date: 03/08/2022 ?  ?Patient will demonstrate at least 150 degrees of Lt shoulder flexion and abduction AROM to improve ability to complete overhead reaching.  ?Baseline: see above  ?Goal status: INITIAL ?  ?2.  Patient will demonstrate at least 70 degrees of Lt shoulder ER AROM to improve ability to complete self-care activities.  ?Baseline: see above ?Goal status: INITIAL ?  ?3.  Patient will demonstrate at least 4+/5 Lt shoulder strength to improve GHJ stability necessary for lifting and carrying activities.  ?Baseline: see above  ?Goal status: INITIAL ?  ?3.  Patient will score </=35% disability on QuickDASH to signify clinically meaningful improvement in functional abilities.  ?Baseline: see above  ?Goal status: INITIAL ?  ?  ?PLAN: ?PT FREQUENCY: 2x/week ?  ?PT DURATION: 6 weeks ?  ?PLANNED INTERVENTIONS: Therapeutic exercises, Therapeutic activity, Neuromuscular re-education, Patient/Family education, Joint mobilization, Dry Needling, Electrical stimulation, Cryotherapy, Moist heat, Taping, Vasopneumatic device, Ultrasound, Ionotophoresis 4mg /ml Dexamethasone, and Manual therapy ?  ?PLAN FOR NEXT SESSION: review HEP, progress shoulder ROM and strength as tolerated; consider ionto  ? ? ? ?Hilda Blades, PT, DPT, LAT, ATC ?03/02/22  8:31 AM ?Phone: [phone] ?Fax: [phone] ? ? ?   ? ?

## 2022-03-03 ENCOUNTER — Ambulatory Visit: Payer: 59 | Admitting: Physical Therapy

## 2022-03-07 NOTE — Therapy (Addendum)
OUTPATIENT PHYSICAL THERAPY TREATMENT NOTE  DISCHARGE   Patient Name: Shawn Potts MRN: [id number] DOB:05-Jan-1966, 56 y.o., male Today's Date: 03/08/2022  PCP: Donald Prose, MD REFERRING PROVIDER: Donald Prose, MD   PT End of Session - 03/08/22 1453     Visit Number 5    Number of Visits 11    Date for PT Re-Evaluation 04/19/22    Authorization Type MCR / MCD    PT Start Time 2952    PT Stop Time 1530    PT Time Calculation (min) 38 min    Activity Tolerance Patient tolerated treatment well    Behavior During Therapy [facility] for tasks assessed/performed                Past Medical History:  Diagnosis Date   Asthma    bronchial asthma,    High cholesterol    Hypertension    Seizures ([place]) last seizure [phone]   Umbilical hernia    Past Surgical History:  Procedure Laterality Date   BRAIN SURGERY  x 4, lasy 2005 or 2006   BRAIN SURGERY     SHOULDER SURGERY Right yrs ago   [place]  yrs ago   cervical fusion   surgery on head  age 25   fell thru glass window, neck seriously injured   UMBILICAL HERNIA REPAIR N/A 09/18/2020   Procedure: [place];  Surgeon: Clovis Riley, MD;  Location: [place];  Service: General;  Laterality: N/A;   Patient Active Problem List   Diagnosis Date Noted   Traumatic tear of supraspinatus tendon of left shoulder 08/10/2021   Impingement syndrome of left shoulder 08/10/2021   Arthrosis of left acromioclavicular joint 08/10/2021   Pulmonary emphysema ([place]) 07/22/2021   Chest pain 02/06/2012   HTN (hypertension) 02/06/2012   Seizure disorder ([place]) 02/06/2012   Tobacco abuse 02/06/2012    REFERRING PROVIDER: Leandrew Koyanagi, MD   REFERRING DIAG: Impingement syndrome of left shoulder   THERAPY DIAG:  Chronic left shoulder pain  Muscle weakness (generalized)  Abnormal posture  PERTINENT HISTORY: Previous traumatic cervical injury as a teenager (fell through glass window),  Per patient Cervical fusion 2007  PRECAUTIONS: Fall risk due to epilepsy (has not experienced a seizure in 1.5 years)  SUBJECTIVE: Patient reports his neck has been very painful, he was not able to get to the last session due to court. He is planing to have surgery on his neck due to the pain from previous injuries and previous cervical fusion.  PAIN:  Are you having pain? Yes:  NPRS scale: 6/10 Pain location: Left shoulder Pain description: Intermittent, burning, irritation Aggravating factors: Lifting, sleep Relieving factors: Medication  PATIENT GOALS "I want to be able to use the shoulder somewhat, better than I can."    OBJECTIVE:  PATIENT SURVEYS:  Quick Dash 50% disability  03/08/2022: 45.5%   POSTURE: Forward head, rounded shoulders    UPPER EXTREMITY ROM:    Active ROM Right 01/25/2022 Left 01/25/2022 Left 03/08/2022  Shoulder flexion   130 pain on descent 145  Shoulder extension       Shoulder abduction   110 pain   Shoulder adduction       Shoulder internal rotation   WNL   Shoulder external rotation   50 pain 55  (Blank rows = not tested)   UPPER EXTREMITY MMT:   MMT Right 01/25/2022 Left 01/25/2022 Left 02/15/2022 Left 03/08/2022  Shoulder flexion 5/5 4-/5 4/5 4/5  Shoulder extension        Shoulder abduction 5/5 5/5    Shoulder adduction        Shoulder internal rotation 5/5 4+/5    Shoulder external rotation 5/5 4/5  4/5  Middle trapezius        Lower trapezius        (Blank rows = not tested)   SHOULDER SPECIAL TESTS:            (+) Empty can (+) Neers (+) Hawkin's Kennedy    JOINT MOBILITY TESTING:  Lt GHJ hypomobile all planes   PALPATION:  TTP greater tubercle, clavicle               TODAY'S TREATMENT:  [facility] Adult PT Treatment:                                                DATE: 03/08/2022 Therapeutic Exercise: UBE L5 x 6 min (3 fwd/bwd) while taking subejctive Supine shoulder flexion with dowel 4# 2 x 10 Supine scapular punch with dowel  4# 2 x 10 Supine banded shoulder flexion 90+ deg with red 2 x 10 Supine horizontal abduction with red 2 x 10 Standing shoulder ER/IR with green 2 x 15 each Row with green 2 x 15 Extension and scap retraction with green 2 x 15 Standing scaption with 5# 2 x 10   [facility] Adult PT Treatment:                                                DATE: 02/24/2022 Therapeutic Exercise: UBE L4 x 4 min (2 fwd/bwd) while taking subejctive Supine shoulder flexion with physioball 2 x 10 Supine banded shoulder flexion 90+ deg with red 2 x 10 Supine horizontal abduction with red 2 x 10 Reclined shoulder flexion with 2# 2 x 10 Standing shoulder ER/IR with green 2 x 15 each Row with green 2 x 15 Extension and scap retraction with green 2 x 15 Standing scaption with 5# 2 x 10 Tall plank on elevated table with alternating shoulder taps 2 x 20  [facility] Adult PT Treatment:                                                DATE: 02/17/2022 Therapeutic Exercise: UBE L1 x 4 min (2 fwd/bwd) while taking subejctive Supine dowel press with overhead flexion 2# 2 x 5 Supine horizontal abduction with red 2 x 10 Reclined shoulder flexion with 2# 2 x 10 Sidelying abduction with 3# 2 x 10 Standing shoulder ER with yellow 2 x 10 Row with green 2 x 10 Standing shoulder IR with green 2 x 10 Extension and scap retraction with green 2 x 10 Standing scaption 2 x 10 Wall slide shoulder flexion 2 x 10    PATIENT EDUCATION: Education details: POC extension, HEP Person educated: Patient Education method: Explanation, Demonstration, Tactile cues, Verbal cues Education comprehension: verbalized understanding, returned demonstration, verbal cues required, tactile cues required, and needs further education   HOME EXERCISE PROGRAM: Access Code: [email or web address]     ASSESSMENT: CLINICAL IMPRESSION: Patient tolerated  therapy well with no adverse effects. He demonstrates overall improvement in his left shoulder motion and strength compared to  evaluation and is progressing with functional ability. Patient arrived reporting increased neck pain that has caused a lot of limitation for him lately, and he is planning to have cervical surgery at some point in the near future. Therapy continues to focus on progressing shoulder mobility and strengthening. He is able to complete all prescribed exercises and states improvement in symptoms post therapy. No changes to HEP this visit. Possibly trial of dry needling next visit for neck and shoulder. He will benefit from skilled PT to address his left shoulder pain, ROM impairments, strength deficits, and postural dysfunction in order to reduce pain and optimize his function. Will extend PT POC for 6 more weeks at frequency of 1x/week.     OBJECTIVE IMPAIRMENTS decreased ROM, decreased strength, hypomobility, impaired UE functional use, postural dysfunction, and pain.    ACTIVITY LIMITATIONS cleaning, driving, yard work, and shopping.    PERSONAL FACTORS Age, Profession, Time since onset of injury/illness/exacerbation, and 3+ comorbidities: see PMH and surgical history above  are also affecting patient's functional outcome.      GOALS: SHORT TERM GOALS: Target date: 02/15/2022   Patient will be independent and compliant with initial HEP.  Baseline: issued at eval 02/15/2022: progression 03/08/2022: independent Goal status: MET   2.  Patient will demonstrate knowledge and application of appropriate sitting posture.  Baseline: forward head; rounded shoulders  02/15/2022: still needs cues 03/08/2022: able to demo proper seated posture Goal status: MET    LONG TERM GOALS: Target date: 04/19/2022   Patient will demonstrate at least 150 degrees of Lt shoulder flexion and abduction AROM to improve ability to complete overhead reaching.  Baseline: see above  03/08/2022: 145 deg Goal status: PARTIALLY MET   2.  Patient will demonstrate at least 70 degrees of Lt shoulder ER AROM to improve ability to  complete self-care activities.  Baseline: see above 03/08/2022: 55 deg Goal status: PARTIALLY MET   3.  Patient will demonstrate at least 4+/5 Lt shoulder strength to improve GHJ stability necessary for lifting and carrying activities.  Baseline: see above  03/08/2022: grossly 4/5 MMT Goal status: PARTIALLY MET   3.  Patient will score </=35% disability on QuickDASH to signify clinically meaningful improvement in functional abilities.  Baseline: see above  03/08/2022: 45.5% Goal status: PARTIALLY MET     PLAN: PT FREQUENCY: 1x/week   PT DURATION: 6 weeks   PLANNED INTERVENTIONS: Therapeutic exercises, Therapeutic activity, Neuromuscular re-education, Patient/Family education, Joint mobilization, Dry Needling, Electrical stimulation, Cryotherapy, Moist heat, Taping, Vasopneumatic device, Ultrasound, Ionotophoresis 6m/ml Dexamethasone, and Manual therapy   PLAN FOR NEXT SESSION: Review HEP, progress shoulder ROM and strength as tolerated; trial dry needling for neck and shoulder    CHilda Blades PT, DPT, LAT, ATC 03/08/22  3:39 PM Phone: [phone]: [phone]    PHYSICAL THERAPY DISCHARGE SUMMARY  Visits from Start of Care: 5  Current functional level related to goals / functional outcomes: See above   Remaining deficits: See above   Education / Equipment: HEP   Patient agrees to discharge. Patient goals were partially met. Patient is being discharged due to not returning since the last visit.

## 2022-03-08 ENCOUNTER — Ambulatory Visit: Payer: 59 | Attending: Orthopaedic Surgery | Admitting: Physical Therapy

## 2022-03-08 ENCOUNTER — Other Ambulatory Visit: Payer: Self-pay

## 2022-03-08 ENCOUNTER — Encounter: Payer: Self-pay | Admitting: Physical Therapy

## 2022-03-08 DIAGNOSIS — R293 Abnormal posture: Secondary | ICD-10-CM | POA: Diagnosis present

## 2022-03-08 DIAGNOSIS — G8929 Other chronic pain: Secondary | ICD-10-CM | POA: Insufficient documentation

## 2022-03-08 DIAGNOSIS — M6281 Muscle weakness (generalized): Secondary | ICD-10-CM | POA: Insufficient documentation

## 2022-03-08 DIAGNOSIS — M25512 Pain in left shoulder: Secondary | ICD-10-CM | POA: Insufficient documentation

## 2022-03-16 NOTE — Therapy (Incomplete)
?OUTPATIENT PHYSICAL THERAPY TREATMENT NOTE ? ? ?Patient Name: Shawn Potts ?MRN: [id number] ?DOB:1966/08/20, 56 y.o., male ?Today's Date: 03/16/2022 ? ?PCP: Donald Prose, MD ?REFERRING PROVIDER: Leandrew Koyanagi, MD ? ? ? ? ? ? ? ?Past Medical History:  ?Diagnosis Date  ? Asthma   ? bronchial asthma,   ? High cholesterol   ? Hypertension   ? Seizures ([place]) last seizure 09-2019  ? Umbilical hernia   ? ?Past Surgical History:  ?Procedure Laterality Date  ? BRAIN SURGERY  x 4, lasy 2005 or 2006  ? BRAIN SURGERY    ? SHOULDER SURGERY Right yrs ago  ? SPINAL FIXATION SURGERY  yrs ago  ? cervical fusion  ? surgery on head  age 70  ? fell thru glass window, neck seriously injured  ? UMBILICAL HERNIA REPAIR N/A 09/18/2020  ? Procedure: OPEN UMBILICAL HERNIA REPAIR;  Surgeon: Clovis Riley, MD;  Location: [place];  Service: General;  Laterality: N/A;  ? ?Patient Active Problem List  ? Diagnosis Date Noted  ? Traumatic tear of supraspinatus tendon of left shoulder 08/10/2021  ? Impingement syndrome of left shoulder 08/10/2021  ? 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 Pulmonary emphysema ([place]) 07/22/2021  ? Chest pain 02/06/2012  ? HTN (hypertension) 02/06/2012  ? Seizure disorder ([place]) 02/06/2012  ? Tobacco abuse 02/06/2012  ? ? ?REFERRING PROVIDER: Leandrew Koyanagi, MD ?  ?REFERRING DIAG: Impingement syndrome of left shoulder  ? ?THERAPY DIAG:  ?No diagnosis found. ? ?PERTINENT HISTORY: Previous traumatic cervical injury as a teenager (fell through glass window), Per patient Cervical fusion 2007 ? ?PRECAUTIONS: Fall risk due to epilepsy (has not experienced a seizure in 1.5 years) ? ?SUBJECTIVE: Patient reports his neck has been very painful, he was not able to get to the last session due to court. He is planing to have surgery on his neck due to the pain from previous injuries and previous cervical fusion. ? ?PAIN:  ?Are you having pain? Yes:  ?NPRS scale: 6/10 ?Pain location: Left  shoulder ?Pain description: Intermittent, burning, irritation ?Aggravating factors: Lifting, sleep ?Relieving factors: Medication ? ?PATIENT GOALS "I want to be able to use the shoulder somewhat, better than I can."  ? ? ?OBJECTIVE:  ?PATIENT SURVEYS:  ?Quick Dash 50% disability ? 03/08/2022: 45.5% ?  ?POSTURE: ?Forward head, rounded shoulders  ?  ?UPPER EXTREMITY ROM:  ?  ?Active ROM Right ?01/25/2022 Left ?01/25/2022 Left ?03/08/2022  ?Shoulder flexion   130 pain on descent 145  ?Shoulder extension       ?Shoulder abduction   110 pain   ?Shoulder adduction       ?Shoulder internal rotation   WNL   ?Shoulder external rotation   50 pain 55  ?(Blank rows = not tested) ?  ?UPPER EXTREMITY MMT: ?  ?MMT Right ?01/25/2022 Left ?01/25/2022 Left ?02/15/2022 Left ?03/08/2022  ?Shoulder flexion 5/5 4-/5 4/5 4/5  ?Shoulder extension        ?Shoulder abduction 5/5 5/5    ?Shoulder adduction        ?Shoulder internal rotation 5/5 4+/5    ?Shoulder external rotation 5/5 4/5  4/5  ?Middle trapezius        ?Lower trapezius        ?(Blank rows = not tested) ?  ?SHOULDER SPECIAL TESTS: ?           (+) Empty can (+) Neers (+) Hawkin's Kennedy  ?  ?JOINT MOBILITY TESTING:  ?Lt GHJ hypomobile  all planes ?  ?PALPATION:  ?TTP greater tubercle, clavicle  ?            ? ?TODAY'S TREATMENT:  ?[facility] Adult PT Treatment:                                                DATE: 03/17/2022 ?Therapeutic Exercise: ?UBE L5 x 6 min (3 fwd/bwd) while taking subejctive ?Supine shoulder flexion with dowel 4# 2 x 10 ?Supine scapular punch with dowel 4# 2 x 10 ?Supine banded shoulder flexion 90+ deg with red 2 x 10 ?Supine horizontal abduction with red 2 x 10 ?Standing shoulder ER/IR with green 2 x 15 each ?Row with green 2 x 15 ?Extension and scap retraction with green 2 x 15 ?Standing scaption with 5# 2 x 10 ? ? ?[facility] Adult PT Treatment:                                                DATE: 03/08/2022 ?Therapeutic Exercise: ?UBE L5 x 6 min (3 fwd/bwd) while taking  subejctive ?Supine shoulder flexion with dowel 4# 2 x 10 ?Supine scapular punch with dowel 4# 2 x 10 ?Supine banded shoulder flexion 90+ deg with red 2 x 10 ?Supine horizontal abduction with red 2 x 10 ?Standing shoulder ER/IR with green 2 x 15 each ?Row with green 2 x 15 ?Extension and scap retraction with green 2 x 15 ?Standing scaption with 5# 2 x 10 ? ?[facility] Adult PT Treatment:                                                DATE: 02/24/2022 ?Therapeutic Exercise: ?UBE L4 x 4 min (2 fwd/bwd) while taking subejctive ?Supine shoulder flexion with physioball 2 x 10 ?Supine banded shoulder flexion 90+ deg with red 2 x 10 ?Supine horizontal abduction with red 2 x 10 ?Reclined shoulder flexion with 2# 2 x 10 ?Standing shoulder ER/IR with green 2 x 15 each ?Row with green 2 x 15 ?Extension and scap retraction with green 2 x 15 ?Standing scaption with 5# 2 x 10 ?Tall plank on elevated table with alternating shoulder taps 2 x 20 ?   ?PATIENT EDUCATION: ?Education details: HEP ?Person educated: Patient ?Education method: Explanation, Demonstration, Tactile cues, Verbal cues ?Education comprehension: verbalized understanding, returned demonstration, verbal cues required, tactile cues required, and needs further education ?  ?HOME EXERCISE PROGRAM: ?Access Code: [email or web address] ?  ?  ?ASSESSMENT: ?CLINICAL IMPRESSION: ?Patient tolerated therapy well with no adverse effects. *** He will benefit from skilled PT to address his left shoulder pain, ROM impairments, strength deficits, and postural dysfunction in order to reduce pain and optimize his function. ? ?He demonstrates overall improvement in his left shoulder motion and strength compared to evaluation and is progressing with functional ability. Patient arrived reporting increased neck pain that has caused a lot of limitation for him lately, and he is planning to have cervical surgery at some point in the near future. Therapy continues to focus on progressing shoulder mobility and  strengthening. He is able to complete all prescribed exercises and states  improvement in symptoms post therapy. No changes to HEP this visit. Possibly trial of dry needling next visit for neck and shoulder. He will benefit from skilled PT to address his left shoulder pain, ROM impairments, strength deficits, and postural dysfunction in order to reduce pain and optimize his function. Will extend PT POC for 6 more weeks at frequency of 1x/week. ?  ?  ?OBJECTIVE IMPAIRMENTS decreased ROM, decreased strength, hypomobility, impaired UE functional use, postural dysfunction, and pain.  ?  ?ACTIVITY LIMITATIONS cleaning, driving, yard work, and shopping.  ?  ?PERSONAL FACTORS Age, Profession, Time since onset of injury/illness/exacerbation, and 3+ comorbidities: see PMH and surgical history above  are also affecting patient's functional outcome.  ?  ?  ?GOALS: ?SHORT TERM GOALS: Target date: 02/15/2022 ?  ?Patient will be independent and compliant with initial HEP.  ?Baseline: issued at eval ?02/15/2022: progression ?03/08/2022: independent ?Goal status: MET ?  ?2.  Patient will demonstrate knowledge and application of appropriate sitting posture.  ?Baseline: forward head; rounded shoulders  ?02/15/2022: still needs cues ?03/08/2022: able to demo proper seated posture ?Goal status: MET ?   ?LONG TERM GOALS: Target date: 04/19/2022 ?  ?Patient will demonstrate at least 150 degrees of Lt shoulder flexion and abduction AROM to improve ability to complete overhead reaching.  ?Baseline: see above  ?03/08/2022: 145 deg ?Goal status: PARTIALLY MET ?  ?2.  Patient will demonstrate at least 70 degrees of Lt shoulder ER AROM to improve ability to complete self-care activities.  ?Baseline: see above ?03/08/2022: 55 deg ?Goal status: PARTIALLY MET ?  ?3.  Patient will demonstrate at least 4+/5 Lt shoulder strength to improve GHJ stability necessary for lifting and carrying activities.  ?Baseline: see above  ?03/08/2022: grossly 4/5 MMT ?Goal status:  PARTIALLY MET ?  ?3.  Patient will score </=35% disability on QuickDASH to signify clinically meaningful improvement in functional abilities.  ?Baseline: see above  ?03/08/2022: 45.5% ?Goal status: PARTIALLY MET ?

## 2022-03-17 ENCOUNTER — Ambulatory Visit: Payer: 59 | Admitting: Physical Therapy

## 2022-03-21 ENCOUNTER — Inpatient Hospital Stay: Admission: RE | Admit: 2022-03-21 | Payer: 59 | Source: Ambulatory Visit

## 2022-04-12 ENCOUNTER — Other Ambulatory Visit: Payer: 59

## 2022-04-13 ENCOUNTER — Ambulatory Visit: Payer: 59 | Attending: Orthopaedic Surgery | Admitting: Physical Therapy
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
## 2022-04-17 ENCOUNTER — Inpatient Hospital Stay: Admission: RE | Admit: 2022-04-17 | Payer: 59 | Source: Ambulatory Visit

## 2022-04-24 ENCOUNTER — Other Ambulatory Visit: Payer: 59

## 2022-09-21 ENCOUNTER — Encounter (HOSPITAL_BASED_OUTPATIENT_CLINIC_OR_DEPARTMENT_OTHER): Payer: 59

## 2022-09-21 ENCOUNTER — Ambulatory Visit (HOSPITAL_BASED_OUTPATIENT_CLINIC_OR_DEPARTMENT_OTHER): Payer: 59 | Admitting: Pulmonary Disease

## 2022-09-21 ENCOUNTER — Other Ambulatory Visit: Payer: Self-pay

## 2022-09-21 DIAGNOSIS — J439 Emphysema, unspecified: Secondary | ICD-10-CM

## 2022-11-23 ENCOUNTER — Emergency Department (HOSPITAL_COMMUNITY)
Admission: EM | Admit: 2022-11-23 | Discharge: 2022-11-24 | Discharge status: Against Medical Advice | Payer: 59 | Attending: Emergency Medicine | Admitting: Emergency Medicine

## 2022-11-23 ENCOUNTER — Emergency Department (HOSPITAL_COMMUNITY): Payer: 59

## 2022-11-23 DIAGNOSIS — J9811 Atelectasis: Secondary | ICD-10-CM | POA: Diagnosis not present

## 2022-11-23 DIAGNOSIS — R569 Unspecified convulsions: Secondary | ICD-10-CM | POA: Diagnosis not present

## 2022-11-23 DIAGNOSIS — Y92410 Unspecified street and highway as the place of occurrence of the external cause: Secondary | ICD-10-CM | POA: Diagnosis not present

## 2022-11-23 DIAGNOSIS — M546 Pain in thoracic spine: Secondary | ICD-10-CM | POA: Diagnosis present

## 2022-11-23 DIAGNOSIS — Z5329 Procedure and treatment not carried out because of patient's decision for other reasons: Secondary | ICD-10-CM | POA: Diagnosis not present

## 2022-11-23 DIAGNOSIS — S22060A Wedge compression fracture of T7-T8 vertebra, initial encounter for closed fracture: Secondary | ICD-10-CM | POA: Diagnosis not present

## 2022-11-23 DIAGNOSIS — Z79899 Other long term (current) drug therapy: Secondary | ICD-10-CM | POA: Diagnosis not present

## 2022-11-23 DIAGNOSIS — I10 Essential (primary) hypertension: Secondary | ICD-10-CM | POA: Insufficient documentation

## 2022-11-23 DIAGNOSIS — K760 Fatty (change of) liver, not elsewhere classified: Secondary | ICD-10-CM | POA: Diagnosis not present

## 2022-11-23 DIAGNOSIS — E876 Hypokalemia: Secondary | ICD-10-CM | POA: Diagnosis not present

## 2022-11-23 DIAGNOSIS — D72829 Elevated white blood cell count, unspecified: Secondary | ICD-10-CM | POA: Insufficient documentation

## 2022-11-23 DIAGNOSIS — R7989 Other specified abnormal findings of blood chemistry: Secondary | ICD-10-CM

## 2022-11-23 LAB — URINALYSIS, ROUTINE W REFLEX MICROSCOPIC
Bacteria, UA: NONE SEEN
Bilirubin Urine: NEGATIVE
Glucose, UA: NEGATIVE mg/dL
Ketones, ur: NEGATIVE mg/dL
Leukocytes,Ua: NEGATIVE
Nitrite: NEGATIVE
Protein, ur: 100 mg/dL — AB
Specific Gravity, Urine: 1.039 — ABNORMAL HIGH (ref 1.005–1.030)
pH: 5 (ref 5.0–8.0)

## 2022-11-23 LAB — COMPREHENSIVE METABOLIC PANEL
ALT: 26 U/L (ref 0–44)
AST: 31 U/L (ref 15–41)
Albumin: 3.8 g/dL (ref 3.5–5.0)
Alkaline Phosphatase: 78 U/L (ref 38–126)
Anion gap: 21 — ABNORMAL HIGH (ref 5–15)
BUN: 17 mg/dL (ref 6–20)
CO2: 17 mmol/L — ABNORMAL LOW (ref 22–32)
Calcium: 9.1 mg/dL (ref 8.9–10.3)
Chloride: 99 mmol/L (ref 98–111)
Creatinine, Ser: 1.69 mg/dL — ABNORMAL HIGH (ref 0.61–1.24)
GFR, Estimated: 47 mL/min — ABNORMAL LOW (ref >60)
Glucose, Bld: 150 mg/dL — ABNORMAL HIGH (ref 70–99)
Potassium: 3.3 mmol/L — ABNORMAL LOW (ref 3.5–5.1)
Sodium: 137 mmol/L (ref 135–145)
Total Bilirubin: <0.1 mg/dL — ABNORMAL LOW (ref 0.3–1.2)
Total Protein: 7.3 g/dL (ref 6.5–8.1)

## 2022-11-23 LAB — CBC
HCT: 43.4 % (ref 39.0–52.0)
Hemoglobin: 14.3 g/dL (ref 13.0–17.0)
MCH: 29.1 pg (ref 26.0–34.0)
MCHC: 32.9 g/dL (ref 30.0–36.0)
MCV: 88.2 fL (ref 80.0–100.0)
Platelets: 372 10*3/uL (ref 150–400)
RBC: 4.92 MIL/uL (ref 4.22–5.81)
RDW: 14 % (ref 11.5–15.5)
WBC: 13.9 10*3/uL — ABNORMAL HIGH (ref 4.0–10.5)
nRBC: 0 % (ref 0.0–0.2)

## 2022-11-23 LAB — SAMPLE TO BLOOD BANK

## 2022-11-23 LAB — LACTIC ACID, PLASMA: Lactic Acid, Venous: >9 mmol/L (ref 0.5–1.9)

## 2022-11-23 LAB — ETHANOL: Alcohol, Ethyl (B): <10 mg/dL (ref <10)

## 2022-11-23 LAB — I-STAT CHEM 8, ED
BUN: 18 mg/dL (ref 6–20)
Calcium, Ion: 1.03 mmol/L — ABNORMAL LOW (ref 1.15–1.40)
Chloride: 102 mmol/L (ref 98–111)
Creatinine, Ser: 1.6 mg/dL — ABNORMAL HIGH (ref 0.61–1.24)
Glucose, Bld: 142 mg/dL — ABNORMAL HIGH (ref 70–99)
HCT: 44 % (ref 39.0–52.0)
Hemoglobin: 15 g/dL (ref 13.0–17.0)
Potassium: 3.2 mmol/L — ABNORMAL LOW (ref 3.5–5.1)
Sodium: 138 mmol/L (ref 135–145)
TCO2: 20 mmol/L — ABNORMAL LOW (ref 22–32)

## 2022-11-23 LAB — CBG MONITORING, ED: Glucose-Capillary: 153 mg/dL — ABNORMAL HIGH (ref 70–99)

## 2022-11-23 LAB — PROTIME-INR
INR: 1 (ref 0.8–1.2)
Prothrombin Time: 12.9 seconds (ref 11.4–15.2)

## 2022-11-23 LAB — TROPONIN I (HIGH SENSITIVITY): Troponin I (High Sensitivity): 18 ng/L — ABNORMAL HIGH (ref <18)

## 2022-11-23 LAB — CK: Total CK: 198 U/L (ref 49–397)

## 2022-11-23 LAB — MAGNESIUM: Magnesium: 2.8 mg/dL — ABNORMAL HIGH (ref 1.7–2.4)

## 2022-11-23 MED ORDER — CARBAMAZEPINE ER 200 MG PO TB12
200.0000 mg | ORAL_TABLET | Freq: Once | ORAL | Status: AC
  Administered 2022-11-23: 200 mg via ORAL
  Filled 2022-11-23: qty 1

## 2022-11-23 MED ORDER — IOHEXOL 350 MG/ML SOLN
75.0000 mL | Freq: Once | INTRAVENOUS | Status: AC | PRN
  Administered 2022-11-23: 75 mL via INTRAVENOUS

## 2022-11-23 MED ORDER — CARBAMAZEPINE 200 MG PO TABS
200.0000 mg | ORAL_TABLET | Freq: Once | ORAL | Status: DC
  Filled 2022-11-23: qty 1

## 2022-11-23 MED ORDER — AMLODIPINE BESYLATE 5 MG PO TABS
10.0000 mg | ORAL_TABLET | Freq: Once | ORAL | Status: AC
  Administered 2022-11-23: 10 mg via ORAL
  Filled 2022-11-23: qty 2

## 2022-11-23 MED ORDER — CARBAMAZEPINE ER 400 MG PO TB12: 400.0000 mg | ORAL_TABLET | Freq: Two times a day (BID) | ORAL | 1 refills | Status: DC

## 2022-11-23 MED ORDER — OXYCODONE-ACETAMINOPHEN 5-325 MG PO TABS
1.0000 | ORAL_TABLET | Freq: Once | ORAL | Status: AC
  Administered 2022-11-23: 1 via ORAL
  Filled 2022-11-23: qty 1

## 2022-11-23 MED ORDER — LACTATED RINGERS IV BOLUS
1000.0000 mL | Freq: Once | INTRAVENOUS | Status: AC
  Administered 2022-11-23: 1000 mL via INTRAVENOUS

## 2022-11-23 MED ORDER — HYDROCHLOROTHIAZIDE 25 MG PO TABS: 25.0000 mg | ORAL_TABLET | Freq: Every day | ORAL | Status: DC

## 2022-11-23 MED ORDER — IPRATROPIUM-ALBUTEROL 0.5-2.5 (3) MG/3ML IN SOLN
3.0000 mL | Freq: Once | RESPIRATORY_TRACT | Status: AC
  Administered 2022-11-23: 3 mL via RESPIRATORY_TRACT
  Filled 2022-11-23: qty 3

## 2022-11-23 MED ORDER — POTASSIUM CHLORIDE CRYS ER 20 MEQ PO TBCR
40.0000 meq | EXTENDED_RELEASE_TABLET | Freq: Once | ORAL | Status: AC
  Administered 2022-11-23: 40 meq via ORAL
  Filled 2022-11-23: qty 2

## 2022-11-23 MED ORDER — LORAZEPAM 2 MG/ML IJ SOLN: 2.0000 mg | Freq: Once | INTRAMUSCULAR | Status: DC | PRN

## 2022-11-23 MED ORDER — OXYCODONE-ACETAMINOPHEN 5-325 MG PO TABS: 1.0000 | ORAL_TABLET | Freq: Once | ORAL | Status: DC | PRN

## 2022-11-23 NOTE — Progress Notes (Signed)
Orthopedic Tech Progress Note Patient Details:  Shawn Potts 11/07/1966 [id number]  Patient ID: Shawn Potts, male   DOB: [birth year], 57 y.o.   MRN: [id number] I attended trauma page. [name] 11/23/2022, 8:17 PM

## 2022-11-23 NOTE — ED Notes (Signed)
Patient transported to CT with TRN.  
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
## 2022-11-23 NOTE — ED Notes (Signed)
[place] placed 
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
## 2022-11-24 DIAGNOSIS — S22060A Wedge compression fracture of T7-T8 vertebra, initial encounter for closed fracture: Secondary | ICD-10-CM | POA: Diagnosis not present

## 2022-11-24 LAB — LACTIC ACID, PLASMA: Lactic Acid, Venous: 2.1 mmol/L (ref 0.5–1.9)

## 2022-11-24 LAB — TROPONIN I (HIGH SENSITIVITY): Troponin I (High Sensitivity): 73 ng/L — ABNORMAL HIGH (ref <18)

## 2022-11-24 MED ORDER — METHOCARBAMOL 500 MG PO TABS: 500.0000 mg | ORAL_TABLET | Freq: Three times a day (TID) | ORAL | 0 refills | Status: AC | PRN

## 2022-11-24 MED ORDER — LACTATED RINGERS IV BOLUS: 1000.0000 mL | Freq: Once | INTRAVENOUS | Status: DC

## 2022-11-24 MED ORDER — METHOCARBAMOL 500 MG PO TABS
500.0000 mg | ORAL_TABLET | Freq: Once | ORAL | Status: AC
  Administered 2022-11-24: 500 mg via ORAL
  Filled 2022-11-24: qty 1
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
## 2022-11-24 NOTE — Progress Notes (Signed)
Orthopedic Tech Progress Note Patient Details:  Shawn Potts 01/14/1966 [id number]  Ortho Devices Type of Ortho Device: Thoracolumbar corset (TLSO) Ortho Device/Splint Interventions: Ordered, Application, Adjustment   Post Interventions Patient Tolerated: Well Instructions Provided: Care of device, Adjustment of device  [name] 11/24/2022, 1:59 AM

## 2023-03-15 ENCOUNTER — Encounter: Payer: Self-pay | Admitting: Dermatology

## 2023-03-15 ENCOUNTER — Ambulatory Visit (INDEPENDENT_AMBULATORY_CARE_PROVIDER_SITE_OTHER): Payer: 59 | Admitting: Dermatology

## 2023-03-15 DIAGNOSIS — F17210 Nicotine dependence, cigarettes, uncomplicated: Secondary | ICD-10-CM | POA: Diagnosis not present

## 2023-03-15 DIAGNOSIS — L570 Actinic keratosis: Secondary | ICD-10-CM

## 2023-03-15 NOTE — Patient Instructions (Addendum)
Due to recent changes in healthcare laws, you may see results of your pathology and/or laboratory studies on MyChart before the doctors have had a chance to review them. We understand that in some cases there may be results that are confusing or concerning to you. Please understand that not all results are received at the same time and often the doctors may need to interpret multiple results in order to provide you with the best plan of care or course of treatment. Therefore, we ask that you please give [place] 2 business days to thoroughly review all your results before contacting the office for clarification. Should we see a critical lab result, you will be contacted sooner.   If You Need Anything After Your Visit  If you have any questions or concerns for your doctor, please call our main line at [phone] If no one answers, please leave a voicemail as directed and we will return your call as soon as possible. Messages left after 4 pm will be answered the following business day.   You may also send [place] a message via [place]. We typically respond to MyChart messages within 1-2 business days.  For prescription refills, please ask your pharmacy to contact our office. Our fax number is [phone].  If you have an urgent issue when the clinic is closed that cannot wait until the next business day, you can page your doctor at the number below.    Please note that while we do our best to be available for urgent issues outside of office hours, we are not available 24/7.   If you have an urgent issue and are unable to reach [place], you may choose to seek medical care at your doctor's office, retail clinic, urgent care center, or emergency room.  If you have a medical emergency, please immediately call 911 or go to the emergency department. In the event of inclement weather, please call our main line at [phone] for an update on the status of any delays or closures.  Dermatology Medication Tips: Please  keep the boxes that topical medications come in in order to help keep track of the instructions about where and how to use these. Pharmacies typically print the medication instructions only on the boxes and not directly on the medication tubes.   If your medication is too expensive, please contact our office at [phone] or send [place] a message through [place].   We are unable to tell what your co-pay for medications will be in advance as this is different depending on your insurance coverage. However, we may be able to find a substitute medication at lower cost or fill out paperwork to get insurance to cover a needed medication.   If a prior authorization is required to get your medication covered by your insurance company, please allow [place] 1-2 business days to complete this process.  Drug prices often vary depending on where the prescription is filled and some pharmacies may offer cheaper prices.  The website www.goodrx.com contains coupons for medications through different pharmacies. The prices here do not account for what the cost may be with help from insurance (it may be cheaper with your insurance), but the website can give you the price if you did not use any insurance.  - You can print the associated coupon and take it with your prescription to the pharmacy.  - You may also stop by our office during regular business hours and pick up a GoodRx coupon card.  - If you need your  prescription sent electronically to a different pharmacy, notify our office through [facility] or by phone at [phone]    ACTINIC KERATOSIS   Actinic keratoses are precancerous spots that appear secondary to cumulative UV radiation exposure/sun exposure over time. They are chronic with expected duration over 1 year. A portion of actinic keratoses will progress to squamous cell carcinoma of the skin. It is not possible to reliably predict which spots will progress to skin cancer and so treatment is  recommended to prevent development of skin cancer.  Recommend daily broad spectrum sunscreen SPF 30+ to sun-exposed areas, reapply every 2 hours as needed.  Recommend staying in the shade or wearing long sleeves, sun glasses (UVA+UVB protection) and wide brim hats (4-inch brim around the entire circumference of the hat). Call for new or changing lesions.

## 2023-03-15 NOTE — Progress Notes (Signed)
   New Patient Visit   Subjective  Shawn Potts is a 57 y.o. male who presents for the following: He says he has a dark spot at the left lower lip x 1 year that gets dry and cracks and then bleeds when picked. No treatment. He uses [employer] lip balm. No personal or family history of skin cancer. No pain.   The following portions of the chart were reviewed this encounter and updated as appropriate: medications, allergies, medical history  Review of Systems:  No other skin or systemic complaints except as noted in HPI or Assessment and Plan.  Objective  Well appearing patient in no apparent distress; mood and affect are within normal limits.  A focused examination was performed of the following areas: lips  Relevant exam findings are noted in the Assessment and Plan.    Assessment & Plan   ACTINIC KERATOSIS Exam: Erythematous thin papules/macules with gritty scale at the left lower lip  Actinic keratoses are precancerous spots that appear secondary to cumulative UV radiation exposure/sun exposure over time. They are chronic with expected duration over 1 year. A portion of actinic keratoses will progress to squamous cell carcinoma of the skin. It is not possible to reliably predict which spots will progress to skin cancer and so treatment is recommended to prevent development of skin cancer.  Recommend daily broad spectrum sunscreen SPF 30+ to sun-exposed areas, reapply every 2 hours as needed.  Recommend staying in the shade or wearing long sleeves, sun glasses (UVA+UVB protection) and wide brim hats (4-inch brim around the entire circumference of the hat). Call for new or changing lesions.  Treatment Plan: LN2 at next office visit due to personal event  Pt is a chronic smoker, explained to him the increased risk of SCC if he doesn't cut back / quit.    Return if symptoms worsen or fail to improve, for [place] Follow Up.  Jaclynn Guarneri, CMA, am acting as scribe for Langston Reusing, MD.   Documentation: I have reviewed the above documentation for accuracy and completeness, and I agree with the above.  Langston Reusing, MD

## 2023-03-29 ENCOUNTER — Ambulatory Visit: Payer: 59 | Admitting: Dermatology

## 2024-01-01 ENCOUNTER — Encounter: Payer: Self-pay | Admitting: Neurology

## 2024-01-01 ENCOUNTER — Ambulatory Visit (INDEPENDENT_AMBULATORY_CARE_PROVIDER_SITE_OTHER): Payer: 59 | Admitting: Neurology

## 2024-01-01 VITALS — BP 162/90 | HR 47 | Ht 67.0 in | Wt 163.2 lb

## 2024-01-01 DIAGNOSIS — G40209 Localization-related (focal) (partial) symptomatic epilepsy and epileptic syndromes with complex partial seizures, not intractable, without status epilepticus: Secondary | ICD-10-CM

## 2024-01-01 MED ORDER — CARBAMAZEPINE ER 200 MG PO TB12: 200.0000 mg | ORAL_TABLET | Freq: Two times a day (BID) | ORAL | 3 refills | Status: AC
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
## 2024-01-01 NOTE — Patient Instructions (Signed)
 Start Carbamazepine 200 mg twice daily  Continue your other medications  Please contact us if you do have a breakthrough seizure  Return in 6 months or sooner if worse

## 2024-03-06 ENCOUNTER — Encounter (INDEPENDENT_AMBULATORY_CARE_PROVIDER_SITE_OTHER): Payer: Self-pay | Admitting: Otolaryngology

## 2024-03-14 ENCOUNTER — Encounter (INDEPENDENT_AMBULATORY_CARE_PROVIDER_SITE_OTHER): Payer: Self-pay | Admitting: Otolaryngology

## 2024-05-13 ENCOUNTER — Other Ambulatory Visit: Payer: Self-pay | Admitting: Physician Assistant

## 2024-05-13 DIAGNOSIS — I739 Peripheral vascular disease, unspecified: Secondary | ICD-10-CM

## 2024-05-16 ENCOUNTER — Ambulatory Visit
Admission: RE | Admit: 2024-05-16 | Discharge: 2024-05-16 | Disposition: A | Discharge status: Home / Self Care | Source: Ambulatory Visit | Attending: Physician Assistant | Admitting: Physician Assistant

## 2024-05-16 DIAGNOSIS — I739 Peripheral vascular disease, unspecified: Secondary | ICD-10-CM | POA: Diagnosis present

## 2024-06-17 ENCOUNTER — Encounter: Payer: Self-pay | Admitting: *Deleted

## 2024-06-21 ENCOUNTER — Encounter (INDEPENDENT_AMBULATORY_CARE_PROVIDER_SITE_OTHER): Admitting: Vascular Surgery

## 2024-06-23 NOTE — Progress Notes (Unsigned)
 Cardiology Office Note   Date:  06/24/2024  ID:  Royale Swamy, DOB 1966-06-28, MRN [id number] PCP: Cecily Katz, PA-C  [facility] Providers Cardiologist:  Caron Poser, MD   History of Present Illness Shawn Potts is a 58 y.o. male PMH HTN, HLD, COPD, tobacco use, PAD, CKD IIIa and epilepsy who presents for further evaluation of abnormal ECG.  Patient reports a 2-year history of significant RLE claudication.  He recently obtained Dopplers which showed an ABI of 0.53 last month.  He is currently smoking and has been since 8.  He says he is on some dose of atorvastatin , but cannot recall which dose.  He denies any rest symptoms.  His last LDL was 175 2019.  He denies any other cardiac symptoms at this time, no chest pain, DOE, syncope, arm pain, LE edema orthopnea.  He does report intermittent dizziness which is not exertional or positional.  Relevant CVD History -Moderate RLE PAD (R ABI 0.53) 05/2024 -Aortic arch atherosclerosis CT 11/2022; no significant CAC (my review) -LVH on ECG   ROS: Pt denies any chest discomfort, jaw pain, arm pain, palpitations, syncope, presyncope, orthopnea, PND, or LE edema.  Studies Reviewed I have independently reviewed the patient's ECG, recent blood work, recent ABIs, and prior CT scan from 11/2022.  Physical Exam VS:  BP (!) 152/100 (BP Location: Right Arm, Patient Position: Sitting, Cuff Size: Normal)   Pulse (!) 48   Ht 5' 7.5 (1.715 m)   Wt 157 lb 12.8 oz (71.6 kg)   SpO2 97%   BMI 24.35 kg/m        Wt Readings from Last 3 Encounters:  06/24/24 157 lb 12.8 oz (71.6 kg)  01/01/24 163 lb 3.2 oz (74 kg)  11/23/22 173 lb (78.5 kg)    GEN: No acute distress. NECK: No JVD; No carotid bruits. CARDIAC: RRR. There is a 1/6 ejection murmur best heard at RUSB.  No rubs, gallops. RESPIRATORY:  Clear to auscultation. EXTREMITIES:  Warm and well-perfused. No edema. Diminished PT and DP pulses on RLE.  ASSESSMENT AND PLAN LVH  by ECG Patient presents for further evaluation of abnormal ECG.  His ECG shows significant LVH.  He has had chronic, uncontrolled, resistant hypertension.  He denies any other symptoms at this time.  Plan: -Echo  PAD Patient has a 2-year history of symptomatic claudication of the right lower extremity which resolves immediately with rest.  He denies any rest symptoms.  Recent ABIs of the right lower extremity were 0.53.  Plan: - Start ASA 81 mg daily - Start xarelto  2.5 twice daily - He reports he is already on some dose of atorvastatin  that he cannot remember; at this point we need aggressive lipid control, so we will increase his atorvastatin  to 80 mg daily and add Zetia  10 mg daily.  His LDL goal should be less than 55 - Will obtain carotid Dopplers given his history of intermittent dizziness - Peripheral vascular consult for consideration of intervention  Resistant HTN Currently on 3 agents.  Has never had a secondary hypertension workup.  Uncontrolled.  He reports he has had hypertension since his 65s which has been uncontrolled.  Plan: - Continue HCTZ 25 mg daily - Increase losartan  from 50 mg to 100 mg daily - Continue amlodipine  10 mg daily - Stop the lisinopril -HCTZ combo medication since he is getting dual thiazide and RAAS inhibition - Will do basic secondary hypertension workup with renin-aldosterone assay and renal artery duplex; he denies any snoring or  known history of OSA and he is of normal body habitus. - We will bring him back in 1 month.  If he is still uncontrolled and secondary hypertension workup is unrevealing, would consider addition of spironolactone and changing his metoprolol  to carvedilol for more blood pressure effect  HLD Uncontrolled.  Last checked in 2019 when the LDL was 175.  We need to control this aggressively.  Plan: - Increase atorvastatin  to 80 mg daily and add Zetia  10 mg daily as above - Recheck lipids - LDL goal less than 55; may need to add  a PCSK9 inhibitor at some point  Tobacco Use Contemplative.  Will refer him to our smoking cessation services.  Cannot use Wellbutrin given his history of epilepsy.       Dispo: RTC 1 month  Signed, Caron Poser, MD   [facility] 06/24/24 1956  After discussion with our pharmacy colleagues, his tegretol  has significant interactions with both Xarelto  and plavix. Therefore, we will opt for SAPT with ASA 81mg  every day. The remainder of his medications and plan can stay the same for now.  Caron Poser, MD

## 2024-06-24 ENCOUNTER — Ambulatory Visit

## 2024-06-24 ENCOUNTER — Telehealth: Payer: Self-pay

## 2024-06-24 VITALS — BP 152/100 | HR 48 | Ht 67.5 in | Wt 157.8 lb

## 2024-06-24 DIAGNOSIS — I739 Peripheral vascular disease, unspecified: Secondary | ICD-10-CM | POA: Diagnosis not present

## 2024-06-24 DIAGNOSIS — I517 Cardiomegaly: Secondary | ICD-10-CM

## 2024-06-24 DIAGNOSIS — Z720 Tobacco use: Secondary | ICD-10-CM

## 2024-06-24 DIAGNOSIS — Z79899 Other long term (current) drug therapy: Secondary | ICD-10-CM

## 2024-06-24 DIAGNOSIS — E782 Mixed hyperlipidemia: Secondary | ICD-10-CM | POA: Diagnosis not present

## 2024-06-24 DIAGNOSIS — I1A0 Resistant hypertension: Secondary | ICD-10-CM

## 2024-06-24 MED ORDER — EZETIMIBE 10 MG PO TABS: 10.0000 mg | ORAL_TABLET | Freq: Every day | ORAL | 3 refills | Status: AC

## 2024-06-24 MED ORDER — ASPIRIN 81 MG PO TBEC: 81.0000 mg | DELAYED_RELEASE_TABLET | Freq: Every day | ORAL | Status: AC

## 2024-06-24 MED ORDER — HYDROCHLOROTHIAZIDE 25 MG PO TABS: 25.0000 mg | ORAL_TABLET | Freq: Every day | ORAL | 3 refills | Status: AC

## 2024-06-24 MED ORDER — ATORVASTATIN CALCIUM 80 MG PO TABS: 80.0000 mg | ORAL_TABLET | Freq: Every day | ORAL | 3 refills | Status: AC

## 2024-06-24 MED ORDER — LOSARTAN POTASSIUM 100 MG PO TABS: 100.0000 mg | ORAL_TABLET | Freq: Every day | ORAL | 3 refills | Status: AC

## 2024-06-24 MED ORDER — RIVAROXABAN 2.5 MG PO TABS: 2.5000 mg | ORAL_TABLET | Freq: Two times a day (BID) | ORAL | 3 refills | Status: DC

## 2024-06-24 NOTE — Patient Instructions (Addendum)
 Medication Instructions:  Your physician recommends the following medication changes.  STOP TAKING: Lisinopril  - Hydrochlorothiazide  10-12.5mg   START TAKING:  Aspirin  81 mg by mouth daily Atorvastatin  (Lipitor) 80 mg by mouth daily Ezetimibe  (Zetia ) 10 mg by mouth daily Rivaroxaban  (Xeralto) 2.5 mg by mouth twice daily Hydrochlorothiazide  25 mg by  mouth daily  INCREASE:  Losartan  (Cozaar ) from 25 mg to 100 mg   Also, Continue all other medications not listed above as prescribed.  *If you need a refill on your cardiac medications before your next appointment, please call your pharmacy*  Lab Work: Your provider would like for you to have following labs drawn today Aldosterone-renin activity, BMP, Direct LDL.   If you have labs (blood work) drawn today and your tests are completely normal, you will receive your results only by: MyChart Message (if you have MyChart) OR A paper copy in the mail If you have any lab test that is abnormal or we need to change your treatment, we will call you to review the results.  Testing/Procedures:  Your physician has requested that you have an echocardiogram. Echocardiography is a painless test that uses sound waves to create images of your heart. It provides your doctor with information about the size and shape of your heart and how well your heart's chambers and valves are working.   You may receive an ultrasound enhancing agent through an IV if needed to better visualize your heart during the echo. This procedure takes approximately one hour.  There are no restrictions for this procedure.  This will take place at [address] [facility] [address], [place] [address]  Please note: We ask at that you not bring children with you during ultrasound (echo/ vascular) testing. Due to room size and safety concerns, children are not allowed in the ultrasound rooms during exams. Our front office staff cannot provide observation of children in  our lobby area while testing is being conducted. An adult accompanying a patient to their appointment will only be allowed in the ultrasound room at the discretion of the ultrasound technician under special circumstances. We apologize for any inconvenience.   Your physician has requested that you have a renal artery duplex. During this test, an ultrasound is used to evaluate blood flow to the kidneys. Take your medications as you usually do. This will take place at [address] [facility] [address]) [address], [place] [address].  No food after 11PM the night before.  Water is OK. (Don't drink liquids if you have been instructed not to for ANOTHER test). Avoid foods that produce bowel gas, for 24 hours prior to exam (see below). No breakfast, no chewing gum, no smoking or carbonated beverages. Patient may take morning medications with water. Come in for test at least 15 minutes early to register.  Please note: We ask at that you not bring children with you during ultrasound (echo/ vascular) testing. Due to room size and safety concerns, children are not allowed in the ultrasound rooms during exams. Our front office staff cannot provide observation of children in our lobby area while testing is being conducted. An adult accompanying a patient to their appointment will only be allowed in the ultrasound room at the discretion of the ultrasound technician under special circumstances. We apologize for any inconvenience.     Your physician has requested that you have a carotid duplex. This test is an ultrasound of the carotid arteries in your neck. It looks at blood flow through these arteries that  supply the brain with blood.   Allow one hour for this exam.  There are no restrictions or special instructions.  This will take place at [address] [facility] [address], [place] [address]  Please note: We ask at that you not bring children with you during ultrasound (echo/ vascular)  testing. Due to room size and safety concerns, children are not allowed in the ultrasound rooms during exams. Our front office staff cannot provide observation of children in our lobby area while testing is being conducted. An adult accompanying a patient to their appointment will only be allowed in the ultrasound room at the discretion of the ultrasound technician under special circumstances. We apologize for any inconvenience.    Follow-Up: At [facility], you and your health needs are our priority.  As part of our continuing mission to provide you with exceptional heart care, our providers are all part of one team.  This team includes your primary Cardiologist (physician) and Advanced Practice Providers or APPs (Physician Assistants and Nurse Practitioners) who all work together to provide you with the care you need, when you need it.  Your next appointment:   1 month(s)  Provider:   You may see Caron Poser, MD    We recommend signing up for the patient portal called MyChart.  Sign up information is provided on this After Visit Summary.  MyChart is used to connect with patients for Virtual Visits (Telemedicine).  Patients are able to view lab/test results, encounter notes, upcoming appointments, etc.  Non-urgent messages can be sent to your provider as well.   To learn more about what you can do with MyChart, go to [email or web address].   Other Instruction:  SMOKING CESSATION:  Steps to Quit Smoking Referral:  TELEHEALTH Smoking Cessation Outpatient

## 2024-06-24 NOTE — Telephone Encounter (Signed)
 Carbamazepine  will decrease serum concentration of rivaroxaban .  When rivaroxaban  is used for anticoagulation purposes, the two medications should not be used together, due to risk of anticoagulation failure.  Shawn Potts is using the 2.5 mg bid for PAD.  He will probably not get much benefit from the medication, but not the same life threatening concern as anticoagulation dose.  Will refer to MD to determine if he should stop or not.    Carbamazepine  will also decrease concentration of atorvastatin , but as he is on atorva 80, you should still get a good benefit overall.  Just monitor labs.    Mayer Vondrak

## 2024-06-24 NOTE — Telephone Encounter (Signed)
 Pt c/o medication issue:  1. Name of Medication:   carbamazepine  (TEGRETOL -XR) 200 MG 12 hr tablet rivaroxaban  (XARELTO ) 2.5 MG TABS tablet  atorvastatin  (LIPITOR) 80 MG tablet  2. How are you currently taking this medication (dosage and times per day)?   3. Are you having a reaction (difficulty breathing--STAT)? No  4. What is your medication issue? Pharmacy states that above medications have interactions. Please advise

## 2024-06-24 NOTE — Addendum Note (Signed)
 Addended by: ARGENTINA CLAP on: 06/24/2024 07:59 PM   Modules accepted: Orders

## 2024-06-25 NOTE — Telephone Encounter (Signed)
 This morning, I contacted the patient as per Dr. Martine request to discuss concerns raised by the pharmacist regarding the concurrent use of Carbamazepine  and Xarelto . I clarified that Carbamazepine  may hinder the anticoagulant effectiveness of Xarelto , resulting in no anticipated benefits from the combination of these medications. Following this discussion, I advised the patient to discontinue Xarelto  due to the interaction with Carbamazepine . For the time being, I recommended that the patient continue taking Aspirin  for anticoagulation purposes.  All questions were answered and pt verbalized understanding

## 2024-06-26 ENCOUNTER — Telehealth: Payer: Self-pay | Admitting: *Deleted

## 2024-06-26 NOTE — Telephone Encounter (Signed)
 Duplicate telephone inquiry. Created inadvertently.

## 2024-06-27 NOTE — Telephone Encounter (Signed)
 Spoke with patient and reviewed Dr. Martine recommendations and he verbalized understanding. No further needs at this time.

## 2024-06-27 NOTE — Telephone Encounter (Signed)
 Alan from [facility] is requesting a callback at [phone] regarding them needing an update to settle the medication interactions situation. Please advise

## 2024-06-27 NOTE — Telephone Encounter (Signed)
 Spoke with Alan at [facility] and reviewed provider recommendations to stop the xarelto  and stay on aspirin  81 mg daily. She states they have not been able to reach patient to review this information. Advised that I will try to reach him as well. She verbalized understanding, read back instructions, and had no further questions.

## 2024-06-28 NOTE — Telephone Encounter (Signed)
 The clopidogrel is fine to mix with carbamazepine , if you think he would benefit from that.  It's just DOAC's (for us ) that is the problem.

## 2024-07-01 ENCOUNTER — Encounter: Payer: Self-pay | Admitting: Neurology

## 2024-07-01 ENCOUNTER — Ambulatory Visit: Payer: 59 | Admitting: Neurology

## 2024-07-02 ENCOUNTER — Telehealth: Payer: Self-pay | Admitting: *Deleted

## 2024-07-02 NOTE — Telephone Encounter (Signed)
 error

## 2024-07-09 ENCOUNTER — Telehealth: Payer: Self-pay | Admitting: *Deleted

## 2024-07-09 NOTE — Telephone Encounter (Signed)
 Encounter opened when reviewing chart. No note needed; reviewing labs

## 2024-07-23 ENCOUNTER — Telehealth: Admitting: Physician Assistant

## 2024-07-23 DIAGNOSIS — Z91199 Patient's noncompliance with other medical treatment and regimen due to unspecified reason: Secondary | ICD-10-CM

## 2024-07-23 NOTE — Progress Notes (Signed)
 The patient no-showed for appointment despite this provider sending direct link with no response and waiting for at least 10 minutes from appointment time for patient to join.   Elsie Velma Lunger, PA-C

## 2024-07-26 ENCOUNTER — Ambulatory Visit

## 2024-08-01 ENCOUNTER — Encounter

## 2024-08-01 ENCOUNTER — Other Ambulatory Visit

## 2024-08-02 ENCOUNTER — Ambulatory Visit
Admission: RE | Admit: 2024-08-02 | Discharge: 2024-08-02 | Disposition: A | Discharge status: Home / Self Care | Source: Ambulatory Visit

## 2024-08-02 DIAGNOSIS — R9431 Abnormal electrocardiogram [ECG] [EKG]: Secondary | ICD-10-CM | POA: Insufficient documentation

## 2024-08-02 DIAGNOSIS — I517 Cardiomegaly: Secondary | ICD-10-CM | POA: Insufficient documentation

## 2024-08-02 DIAGNOSIS — I429 Cardiomyopathy, unspecified: Secondary | ICD-10-CM | POA: Insufficient documentation

## 2024-08-02 DIAGNOSIS — I358 Other nonrheumatic aortic valve disorders: Secondary | ICD-10-CM | POA: Insufficient documentation

## 2024-08-02 DIAGNOSIS — F17210 Nicotine dependence, cigarettes, uncomplicated: Secondary | ICD-10-CM | POA: Diagnosis not present

## 2024-08-03 LAB — ECHOCARDIOGRAM COMPLETE
AR max vel: 2.33 cm2
AV Area VTI: 2.2 cm2
AV Area mean vel: 2.27 cm2
AV Mean grad: 6 mmHg
AV Peak grad: 13.1 mmHg
Ao pk vel: 1.81 m/s
Area-P 1/2: 3.03 cm2
Calc EF: 64.1 %
MV VTI: 2.98 cm2
S' Lateral: 3.4 cm
Single Plane A2C EF: 64.7 %
Single Plane A4C EF: 63 %

## 2024-08-05 ENCOUNTER — Ambulatory Visit: Payer: Self-pay

## 2024-08-05 ENCOUNTER — Ambulatory Visit

## 2024-08-05 ENCOUNTER — Ambulatory Visit (HOSPITAL_COMMUNITY): Admission: RE | Admit: 2024-08-05 | Discharge: 2024-08-05 | Disposition: A | Source: Ambulatory Visit

## 2024-08-05 ENCOUNTER — Ambulatory Visit (HOSPITAL_COMMUNITY)
Admission: RE | Admit: 2024-08-05 | Discharge: 2024-08-05 | Disposition: A | Discharge status: Home / Self Care | Source: Ambulatory Visit

## 2024-08-05 DIAGNOSIS — I1A0 Resistant hypertension: Secondary | ICD-10-CM | POA: Insufficient documentation

## 2024-08-05 DIAGNOSIS — I6523 Occlusion and stenosis of bilateral carotid arteries: Secondary | ICD-10-CM | POA: Insufficient documentation

## 2024-08-05 NOTE — Progress Notes (Unsigned)
 Cardiology Office Note   Date:  08/06/2024  ID:  Milind, Raether [birth year], MRN [id number] PCP: Cecily Katz, PA-C  [facility] Providers Cardiologist:  Caron Poser, MD   History of Present Illness Shawn Potts is a 58 y.o. male PMH HTN, HLD, COPD, tobacco use, PAD, CKD IIIa and epilepsy who presents for further evaluation of abnormal ECG.  Patient reports a 2-year history of significant RLE claudication.  He recently obtained Dopplers which showed an ABI of 0.53 last month.  He is currently smoking and has been since 8.  He says he is on some dose of atorvastatin , but cannot recall which dose.  He denies any rest symptoms.  His last LDL was 175 2019.  He denies any other cardiac symptoms at this time, no chest pain, DOE, syncope, arm pain, LE edema orthopnea.  He does report intermittent dizziness which is not exertional or positional.  Interval history: Patient reports he is overall feeling about the same since his first visit.  The RLE claudication symptoms are stable and not any worse.  He does note that his blood pressure has been much better controlled recently.  He is otherwise not having any new symptoms.  Relevant CVD History -Renin aldosterone serology pending -Minimal stenosis carotid duplex 08/05/2024 -Normal renal artery duplex 08/05/2024 -TTE 08/03/2024 with mild LVH and otherwise normal biventricular function without significant valvular disease. -Moderate RLE PAD (R ABI 0.53) 05/2024 -Aortic arch atherosclerosis CT 11/2022; no significant CAC (my review) -LVH on ECG   ROS: Pt denies any chest discomfort, jaw pain, arm pain, palpitations, syncope, presyncope, orthopnea, PND, or LE edema.  Studies Reviewed I have independently reviewed the patient's ECG, recent blood work, recent ABIs, and prior CT scan from 11/2022.  Physical Exam VS:  BP 138/82 (BP Location: Left Arm, Patient Position: Sitting, Cuff Size: Normal)   Pulse 78   Wt 158 lb (71.7 kg)    SpO2 97%   BMI 24.38 kg/m        Wt Readings from Last 3 Encounters:  08/06/24 158 lb (71.7 kg)  06/24/24 157 lb 12.8 oz (71.6 kg)  01/01/24 163 lb 3.2 oz (74 kg)    GEN: No acute distress. NECK: No JVD; No carotid bruits. CARDIAC: RRR. There is a 1/6 ejection murmur best heard at RUSB.  No rubs, gallops. RESPIRATORY:  Clear to auscultation. EXTREMITIES:  Warm and well-perfused. No edema. Diminished PT and DP pulses on RLE.  ASSESSMENT AND PLAN  PAD Patient has a 2-year history of symptomatic claudication of the right lower extremity which resolves immediately with rest.  He denies any rest symptoms.  Recent ABIs of the right lower extremity were 0.53 05/2024.  Modifiable risk factors are suboptimal, but better than last visit.  Plan: - Continue ASA 81 mg daily - We tried to start low-dose Xarelto  at last visit, though there were significant drug interactions with his Tegretol  so this was stopped. Will plan to add on Plavix 75 mg today. - Continue atorvastatin  80 mg daily and Zetia  10 mg daily.  His LDL goal should be less than 55.  A lipid profile is still pending collection from last visit. - Peripheral vascular consult for consideration of intervention given persistent claudication  Resistant HTN Currently on 3 agents.  He reports he has had uncontrolled hypertension since his 66s.  Renal artery duplex was normal.  Renin-aldosterone testing is pending.  His blood pressure is much better controlled since his last visit.  Plan: - Continue HCTZ  25 mg daily - Continue losartan  100 mg daily - Continue amlodipine  10 mg daily. - Renin aldosterone assay pending; based on results, would consider addition of spironolactone or changing his metoprolol  to carvedilol for more antihypertensive effect. I advised him to obtain a blood pressure cuff and check his pressures at home so we have a bit more data to work with.  HLD Uncontrolled.  Last checked in 2019 when the LDL was 175.  We need  to control this aggressively.  Plan: - Continue atorvastatin  80 mg daily and Zetia  10 mg daily - Recheck lipids; a lipid profile is still pending collection from last visit after we increased his Lipitor and added Zetia  - LDL goal less than 55; will need to add a PCSK9 inhibitor if his lipids do not look better today  Tobacco Use Contemplative.  Will refer him to our smoking cessation services.  Cannot use Wellbutrin given his history of epilepsy.       Dispo: RTC 6 months or sooner as needed  Signed, Caron Poser, MD

## 2024-08-06 ENCOUNTER — Ambulatory Visit: Admitting: Cardiovascular Disease

## 2024-08-06 ENCOUNTER — Ambulatory Visit

## 2024-08-06 ENCOUNTER — Encounter: Payer: Self-pay | Admitting: Cardiovascular Disease

## 2024-08-06 VITALS — BP 138/82 | HR 78 | Wt 158.0 lb

## 2024-08-06 DIAGNOSIS — I1A0 Resistant hypertension: Secondary | ICD-10-CM

## 2024-08-06 DIAGNOSIS — E782 Mixed hyperlipidemia: Secondary | ICD-10-CM

## 2024-08-06 DIAGNOSIS — I739 Peripheral vascular disease, unspecified: Secondary | ICD-10-CM

## 2024-08-06 DIAGNOSIS — Z720 Tobacco use: Secondary | ICD-10-CM

## 2024-08-06 DIAGNOSIS — I517 Cardiomegaly: Secondary | ICD-10-CM | POA: Diagnosis not present

## 2024-08-06 DIAGNOSIS — F17200 Nicotine dependence, unspecified, uncomplicated: Secondary | ICD-10-CM

## 2024-08-06 MED ORDER — BLOOD PRESSURE CUFF MISC: 0 refills | Status: AC

## 2024-08-06 MED ORDER — CLOPIDOGREL BISULFATE 75 MG PO TABS: 75.0000 mg | ORAL_TABLET | Freq: Every day | ORAL | 3 refills | Status: AC

## 2024-08-06 NOTE — Patient Instructions (Signed)
 Medication Instructions:  Your physician recommends the following medication changes.  START TAKING: Plavix 75 mg by mouth once daily  *If you need a refill on your cardiac medications before your next appointment, please call your pharmacy*  Lab Work: Your provider would like for you to have following labs drawn today LDL, ALDOSTERONE + RENIN; BMET.   If you have labs (blood work) drawn today and your tests are completely normal, you will receive your results only by: MyChart Message (if you have MyChart) OR A paper copy in the mail If you have any lab test that is abnormal or we need to change your treatment, we will call you to review the results.  Testing/Procedures: No test ordered today   Follow-Up: At [facility], you and your health needs are our priority.  As part of our continuing mission to provide you with exceptional heart care, our providers are all part of one team.  This team includes your primary Cardiologist (physician) and Advanced Practice Providers or APPs (Physician Assistants and Nurse Practitioners) who all work together to provide you with the care you need, when you need it.  Your next appointment:   6 month(s)  Provider:  Caron Poser, MD   We recommend signing up for the patient portal called MyChart.  Sign up information is provided on this After Visit Summary.  MyChart is used to connect with patients for Virtual Visits (Telemedicine).  Patients are able to view lab/test results, encounter notes, upcoming appointments, etc.  Non-urgent messages can be sent to your provider as well.   To learn more about what you can do with MyChart, go to [email or web address].

## 2024-08-07 NOTE — Progress Notes (Signed)
 This encounter was created in error - please disregard.

## 2024-08-12 LAB — ALDOSTERONE + RENIN ACTIVITY W/ RATIO
Aldos/Renin Ratio: 0.2 (ref 0.0–30.0)
Aldosterone: 1.1 ng/dL (ref 0.0–30.0)
Renin Activity, Plasma: 5.203 ng/mL/h (ref 0.167–5.380)

## 2024-08-12 LAB — BASIC METABOLIC PANEL WITH GFR
BUN/Creatinine Ratio: 20 (ref 9–20)
BUN: 23 mg/dL (ref 6–24)
CO2: 24 mmol/L (ref 20–29)
Calcium: 9.3 mg/dL (ref 8.7–10.2)
Chloride: 101 mmol/L (ref 96–106)
Creatinine, Ser: 1.15 mg/dL (ref 0.76–1.27)
Glucose: 92 mg/dL (ref 70–99)
Potassium: 3.5 mmol/L (ref 3.5–5.2)
Sodium: 140 mmol/L (ref 134–144)
eGFR: 73 mL/min/1.73 (ref >59)

## 2024-08-12 LAB — LDL CHOLESTEROL, DIRECT: LDL Direct: 51 mg/dL (ref 0–99)

## 2024-09-10 ENCOUNTER — Ambulatory Visit: Attending: Cardiovascular Disease | Admitting: Cardiovascular Disease

## 2024-09-23 NOTE — Progress Notes (Deleted)
 Cardiology Office Note   Date:  09/24/2024  ID:  Shawn Potts, DOB 1966/07/26, MRN [id number] PCP: Cecily Katz, PA-C  [facility] Providers Cardiologist:  Caron Poser, MD   History of Present Illness Shawn Potts is a 58 y.o. male PMH HTN, HLD, COPD, tobacco use, PAD, CKD IIIa and epilepsy who presents for further evaluation of abnormal ECG.  Patient reports a 2-year history of significant RLE claudication.  He recently obtained Dopplers which showed an ABI of 0.53 last month.  He is currently smoking and has been since 8.  He says he is on some dose of atorvastatin , but cannot recall which dose.  He denies any rest symptoms.  His last LDL was 175 2019.  He denies any other cardiac symptoms at this time, no chest pain, DOE, syncope, arm pain, LE edema orthopnea.  He does report intermittent dizziness which is not exertional or positional.  Interval history: Patient reports he is overall feeling about the same since his first visit.  The RLE claudication symptoms are stable and not any worse.  He does note that his blood pressure has been much better controlled recently.  He is otherwise not having any new symptoms.  Relevant CVD History -Renin aldosterone serology pending -Minimal stenosis carotid duplex 08/05/2024 -Normal renal artery duplex 08/05/2024 -TTE 08/03/2024 with mild LVH and otherwise normal biventricular function without significant valvular disease. -Moderate RLE PAD (R ABI 0.53) 05/2024 -Aortic arch atherosclerosis CT 11/2022; no significant CAC (my review) -LVH on ECG   ROS: Pt denies any chest discomfort, jaw pain, arm pain, palpitations, syncope, presyncope, orthopnea, PND, or LE edema.  Studies Reviewed I have independently reviewed the patient's ECG, recent blood work, recent ABIs, and prior CT scan from 11/2022.  Physical Exam VS:  There were no vitals taken for this visit.       Wt Readings from Last 3 Encounters:  08/06/24 158 lb (71.7  kg)  06/24/24 157 lb 12.8 oz (71.6 kg)  01/01/24 163 lb 3.2 oz (74 kg)    GEN: No acute distress. NECK: No JVD; No carotid bruits. CARDIAC: RRR. There is a 1/6 ejection murmur best heard at RUSB.  No rubs, gallops. RESPIRATORY:  Clear to auscultation. EXTREMITIES:  Warm and well-perfused. No edema. Diminished PT and DP pulses on RLE.  ASSESSMENT AND PLAN  PAD Patient has a 2-year history of symptomatic claudication of the right lower extremity which resolves immediately with rest.  He denies any rest symptoms.  Recent ABIs of the right lower extremity were 0.53 05/2024.  Modifiable risk factors are suboptimal, but better than last visit.  Plan: - Continue ASA 81 mg daily - We tried to start low-dose Xarelto  at last visit, though there were significant drug interactions with his Tegretol  so this was stopped. Will plan to add on Plavix 75 mg today. - Continue atorvastatin  80 mg daily and Zetia  10 mg daily.  His LDL goal should be less than 55.  A lipid profile is still pending collection from last visit. - Peripheral vascular consult for consideration of intervention given persistent claudication  Resistant HTN Currently on 3 agents.  He reports he has had uncontrolled hypertension since his 75s.  Renal artery duplex was normal.  Renin-aldosterone testing is pending.  His blood pressure is much better controlled since his last visit.  Plan: - Continue HCTZ 25 mg daily - Continue losartan  100 mg daily - Continue amlodipine  10 mg daily. - Renin aldosterone assay pending; based on results, would consider  addition of spironolactone or changing his metoprolol  to carvedilol for more antihypertensive effect. I advised him to obtain a blood pressure cuff and check his pressures at home so we have a bit more data to work with.  HLD Uncontrolled.  Last checked in 2019 when the LDL was 175.  We need to control this aggressively.  Plan: - Continue atorvastatin  80 mg daily and Zetia  10 mg daily -  Recheck lipids; a lipid profile is still pending collection from last visit after we increased his Lipitor and added Zetia  - LDL goal less than 55; will need to add a PCSK9 inhibitor if his lipids do not look better today  Tobacco Use Contemplative.  Will refer him to our smoking cessation services.  Cannot use Wellbutrin given his history of epilepsy.       Dispo: RTC 6 months or sooner as needed  Signed, Caron Poser, MD

## 2024-09-24 ENCOUNTER — Encounter: Payer: Self-pay | Admitting: *Deleted

## 2024-09-24 ENCOUNTER — Ambulatory Visit

## 2024-10-01 ENCOUNTER — Ambulatory Visit: Attending: Cardiovascular Disease | Admitting: Cardiovascular Disease
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
## 2024-10-02 ENCOUNTER — Encounter: Payer: Self-pay | Admitting: Cardiovascular Disease

## 2024-10-23 ENCOUNTER — Encounter: Payer: Self-pay | Admitting: Cardiovascular Disease
